# Patient Record
Sex: Male | Born: 1960 | Race: White | Hispanic: No | Marital: Married | Smoking: Never smoker
Health system: Southern US, Community
[De-identification: ages and names within clinical notes are randomized; demographics above are authoritative.]

## PROBLEM LIST (undated history)

## (undated) DIAGNOSIS — R000 Tachycardia, unspecified: Secondary | ICD-10-CM

## (undated) DIAGNOSIS — E785 Hyperlipidemia, unspecified: Secondary | ICD-10-CM

## (undated) DIAGNOSIS — R079 Chest pain, unspecified: Secondary | ICD-10-CM

## (undated) DIAGNOSIS — T887XXA Unspecified adverse effect of drug or medicament, initial encounter: Secondary | ICD-10-CM

## (undated) DIAGNOSIS — I2510 Atherosclerotic heart disease of native coronary artery without angina pectoris: Secondary | ICD-10-CM

## (undated) DIAGNOSIS — I440 Atrioventricular block, first degree: Secondary | ICD-10-CM

## (undated) DIAGNOSIS — Z951 Presence of aortocoronary bypass graft: Secondary | ICD-10-CM

## (undated) HISTORY — DX: Chest pain, unspecified: R07.9

## (undated) HISTORY — DX: Presence of aortocoronary bypass graft: Z95.1

## (undated) HISTORY — PX: HAND SURGERY: SHX662

## (undated) HISTORY — DX: Tachycardia, unspecified: R00.0

## (undated) HISTORY — PX: CORONARY ARTERY BYPASS GRAFT: SHX141

## (undated) HISTORY — DX: Hyperlipidemia, unspecified: E78.5

## (undated) HISTORY — PX: WISDOM TOOTH EXTRACTION: SHX21

## (undated) HISTORY — DX: Atherosclerotic heart disease of native coronary artery without angina pectoris: I25.10

## (undated) HISTORY — DX: Unspecified adverse effect of drug or medicament, initial encounter: T88.7XXA

## (undated) HISTORY — DX: Atrioventricular block, first degree: I44.0

## (undated) HISTORY — PX: VASECTOMY: SHX75

---

## 2004-07-01 ENCOUNTER — Ambulatory Visit: Payer: Self-pay | Admitting: Internal Medicine

## 2005-07-12 ENCOUNTER — Ambulatory Visit: Payer: Self-pay | Admitting: Internal Medicine

## 2005-08-11 ENCOUNTER — Ambulatory Visit: Payer: Self-pay | Admitting: Internal Medicine

## 2007-02-11 ENCOUNTER — Ambulatory Visit: Payer: Self-pay | Admitting: Internal Medicine

## 2007-02-11 LAB — CONVERTED CEMR LAB
ALT: 20 units/L (ref 0–40)
AST: 22 units/L (ref 0–37)
Albumin: 4.2 g/dL (ref 3.5–5.2)
Alkaline Phosphatase: 82 units/L (ref 39–117)
BUN: 11 mg/dL (ref 6–23)
Basophils Absolute: 0 10*3/uL (ref 0.0–0.1)
Basophils Relative: 0.2 % (ref 0.0–1.0)
Bilirubin, Direct: 0.1 mg/dL (ref 0.0–0.3)
CO2: 32 meq/L (ref 19–32)
Calcium: 9.3 mg/dL (ref 8.4–10.5)
Chloride: 109 meq/L (ref 96–112)
Cholesterol: 218 mg/dL (ref 0–200)
Creatinine, Ser: 1 mg/dL (ref 0.4–1.5)
Direct LDL: 161.2 mg/dL
Eosinophils Absolute: 0.1 10*3/uL (ref 0.0–0.6)
Eosinophils Relative: 1.4 % (ref 0.0–5.0)
GFR calc Af Amer: 103 mL/min
GFR calc non Af Amer: 86 mL/min
Glucose, Bld: 88 mg/dL (ref 70–99)
HCT: 47.8 % (ref 39.0–52.0)
HDL: 34.1 mg/dL — ABNORMAL LOW (ref >39.0)
Hemoglobin: 16.4 g/dL (ref 13.0–17.0)
Lymphocytes Relative: 16.7 % (ref 12.0–46.0)
MCHC: 34.4 g/dL (ref 30.0–36.0)
MCV: 90.1 fL (ref 78.0–100.0)
Monocytes Absolute: 1 10*3/uL — ABNORMAL HIGH (ref 0.2–0.7)
Monocytes Relative: 10.4 % (ref 3.0–11.0)
Neutro Abs: 6.6 10*3/uL (ref 1.4–7.7)
Neutrophils Relative %: 71.3 % (ref 43.0–77.0)
Platelets: 279 10*3/uL (ref 150–400)
Potassium: 4.1 meq/L (ref 3.5–5.1)
RBC: 5.31 M/uL (ref 4.22–5.81)
RDW: 11.6 % (ref 11.5–14.6)
Sodium: 143 meq/L (ref 135–145)
TSH: 1.55 microintl units/mL (ref 0.35–5.50)
Total Bilirubin: 1.1 mg/dL (ref 0.3–1.2)
Total CHOL/HDL Ratio: 6.4
Total Protein: 7.6 g/dL (ref 6.0–8.3)
Triglycerides: 134 mg/dL (ref 0–149)
VLDL: 27 mg/dL (ref 0–40)
WBC: 9.2 10*3/uL (ref 4.5–10.5)

## 2007-02-15 DIAGNOSIS — E785 Hyperlipidemia, unspecified: Secondary | ICD-10-CM | POA: Insufficient documentation

## 2007-02-15 HISTORY — DX: Hyperlipidemia, unspecified: E78.5

## 2007-05-14 ENCOUNTER — Telehealth: Payer: Self-pay | Admitting: Internal Medicine

## 2007-07-17 ENCOUNTER — Ambulatory Visit: Payer: Self-pay | Admitting: Internal Medicine

## 2007-07-19 LAB — CONVERTED CEMR LAB
Cholesterol: 218 mg/dL (ref 0–200)
Direct LDL: 165.9 mg/dL
HDL: 32.8 mg/dL — ABNORMAL LOW (ref >39.0)
Total CHOL/HDL Ratio: 6.6
Triglycerides: 135 mg/dL (ref 0–149)
VLDL: 27 mg/dL (ref 0–40)

## 2007-11-05 ENCOUNTER — Telehealth: Payer: Self-pay | Admitting: Internal Medicine

## 2008-01-07 ENCOUNTER — Telehealth: Payer: Self-pay | Admitting: Internal Medicine

## 2008-02-03 ENCOUNTER — Ambulatory Visit: Payer: Self-pay | Admitting: Internal Medicine

## 2008-02-03 DIAGNOSIS — T887XXA Unspecified adverse effect of drug or medicament, initial encounter: Secondary | ICD-10-CM | POA: Insufficient documentation

## 2008-02-03 LAB — CONVERTED CEMR LAB
ALT: 20 units/L (ref 0–53)
AST: 23 units/L (ref 0–37)
Bilirubin, Direct: 0.1 mg/dL (ref 0.0–0.3)
Direct LDL: 149.9 mg/dL
HDL: 31.5 mg/dL — ABNORMAL LOW (ref >39.0)
Total Bilirubin: 1.1 mg/dL (ref 0.3–1.2)
Total Protein: 7 g/dL (ref 6.0–8.3)
Triglycerides: 126 mg/dL (ref 0–149)

## 2008-02-17 ENCOUNTER — Ambulatory Visit: Payer: Self-pay | Admitting: Internal Medicine

## 2008-02-17 DIAGNOSIS — R079 Chest pain, unspecified: Secondary | ICD-10-CM | POA: Insufficient documentation

## 2008-02-17 HISTORY — DX: Chest pain, unspecified: R07.9

## 2008-03-16 ENCOUNTER — Ambulatory Visit: Payer: Self-pay | Admitting: Cardiovascular Disease

## 2008-03-16 LAB — CONVERTED CEMR LAB
Basophils Absolute: 0 10*3/uL (ref 0.0–0.1)
Basophils Relative: 0.1 % (ref 0.0–3.0)
Calcium: 9.6 mg/dL (ref 8.4–10.5)
Creatinine, Ser: 1 mg/dL (ref 0.4–1.5)
Eosinophils Absolute: 0.1 10*3/uL (ref 0.0–0.7)
GFR calc Af Amer: 103 mL/min
GFR calc non Af Amer: 85 mL/min
Hemoglobin: 15.9 g/dL (ref 13.0–17.0)
MCHC: 34.1 g/dL (ref 30.0–36.0)
MCV: 93.2 fL (ref 78.0–100.0)
Neutro Abs: 7.4 10*3/uL (ref 1.4–7.7)
RBC: 5.02 M/uL (ref 4.22–5.81)
RDW: 11.5 % (ref 11.5–14.6)
Sodium: 139 meq/L (ref 135–145)
aPTT: 33.2 s — ABNORMAL HIGH (ref 21.7–29.8)

## 2008-03-17 ENCOUNTER — Ambulatory Visit: Payer: Self-pay | Admitting: Vascular Surgery

## 2008-03-17 ENCOUNTER — Encounter: Payer: Self-pay | Admitting: Surgery

## 2008-03-17 ENCOUNTER — Inpatient Hospital Stay (HOSPITAL_BASED_OUTPATIENT_CLINIC_OR_DEPARTMENT_OTHER): Admission: RE | Admit: 2008-03-17 | Discharge: 2008-03-17 | Payer: Self-pay | Admitting: Cardiology

## 2008-03-17 ENCOUNTER — Ambulatory Visit: Payer: Self-pay | Admitting: Cardiology

## 2008-03-17 ENCOUNTER — Ambulatory Visit (HOSPITAL_COMMUNITY): Admission: RE | Admit: 2008-03-17 | Discharge: 2008-03-17 | Payer: Self-pay | Admitting: Surgery

## 2008-03-19 ENCOUNTER — Ambulatory Visit: Payer: Self-pay | Admitting: Surgery

## 2008-03-23 ENCOUNTER — Inpatient Hospital Stay (HOSPITAL_COMMUNITY): Admission: RE | Admit: 2008-03-23 | Discharge: 2008-03-28 | Payer: Self-pay | Admitting: Surgery

## 2008-03-23 ENCOUNTER — Ambulatory Visit: Payer: Self-pay | Admitting: Cardiology

## 2008-03-23 ENCOUNTER — Ambulatory Visit: Payer: Self-pay | Admitting: Surgery

## 2008-04-15 ENCOUNTER — Ambulatory Visit: Payer: Self-pay | Admitting: Cardiovascular Disease

## 2008-04-21 ENCOUNTER — Encounter: Admission: RE | Admit: 2008-04-21 | Discharge: 2008-04-21 | Payer: Self-pay | Admitting: Surgery

## 2008-04-21 ENCOUNTER — Ambulatory Visit: Payer: Self-pay | Admitting: Cardiothoracic Surgery

## 2008-07-01 ENCOUNTER — Ambulatory Visit: Payer: Self-pay | Admitting: Cardiovascular Disease

## 2008-07-01 LAB — CONVERTED CEMR LAB
ALT: 29 units/L (ref 0–53)
AST: 25 units/L (ref 0–37)
Alkaline Phosphatase: 97 units/L (ref 39–117)
Bilirubin, Direct: 0.1 mg/dL (ref 0.0–0.3)
Total Bilirubin: 0.9 mg/dL (ref 0.3–1.2)

## 2009-01-29 ENCOUNTER — Encounter: Payer: Self-pay | Admitting: Cardiovascular Disease

## 2009-02-08 ENCOUNTER — Telehealth: Payer: Self-pay | Admitting: Cardiovascular Disease

## 2009-06-11 IMAGING — CR DG CHEST 2V
2 series · 2 of 2 positions shown · non-contrast
Comparison: [REDACTED] chest x-ray 03/27/2008

CLINICAL DATA: Slight cough.  Nonsmoker.  Post CABG 03/23/2008.

CHEST - 2 VIEW

[w chest pa]
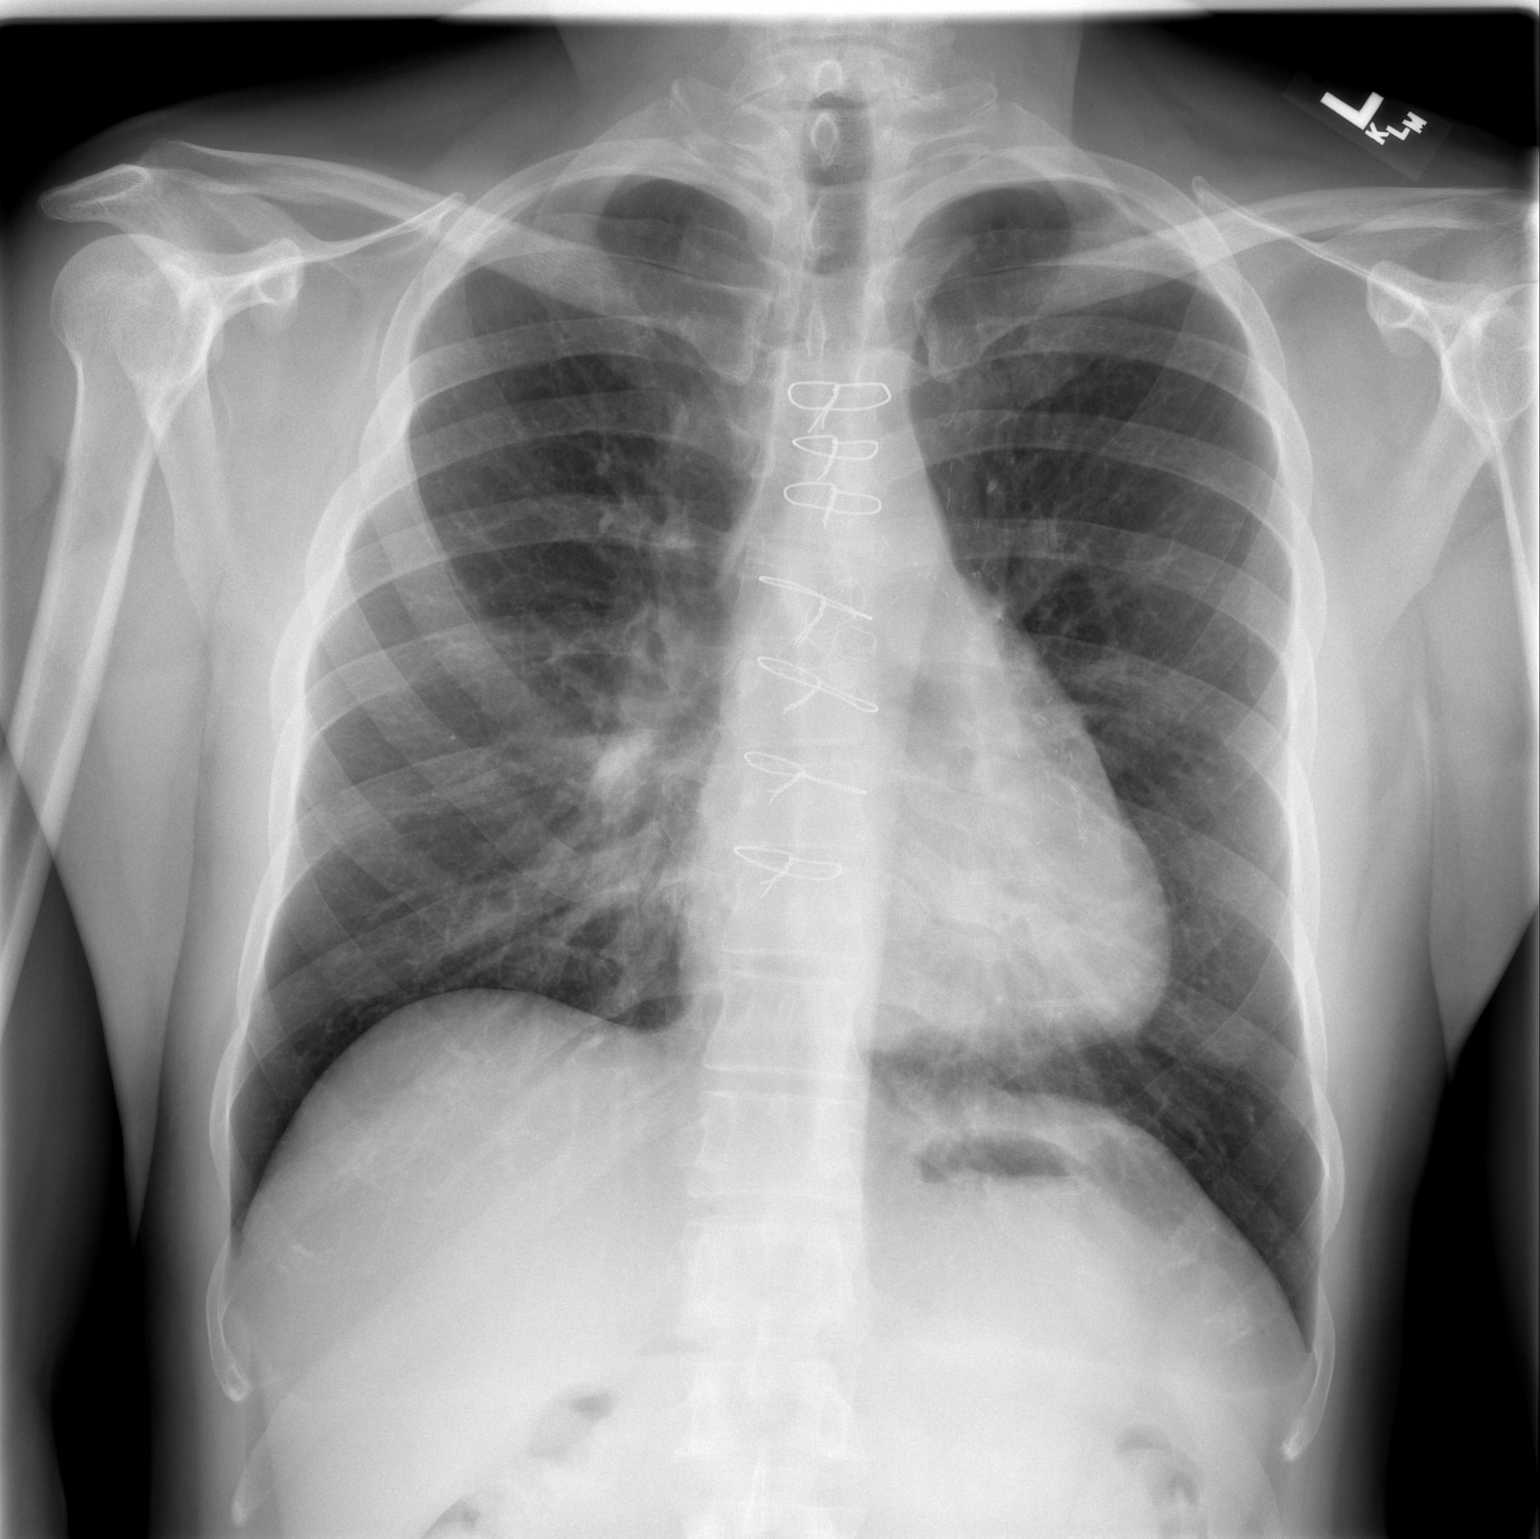

[w chest lat]
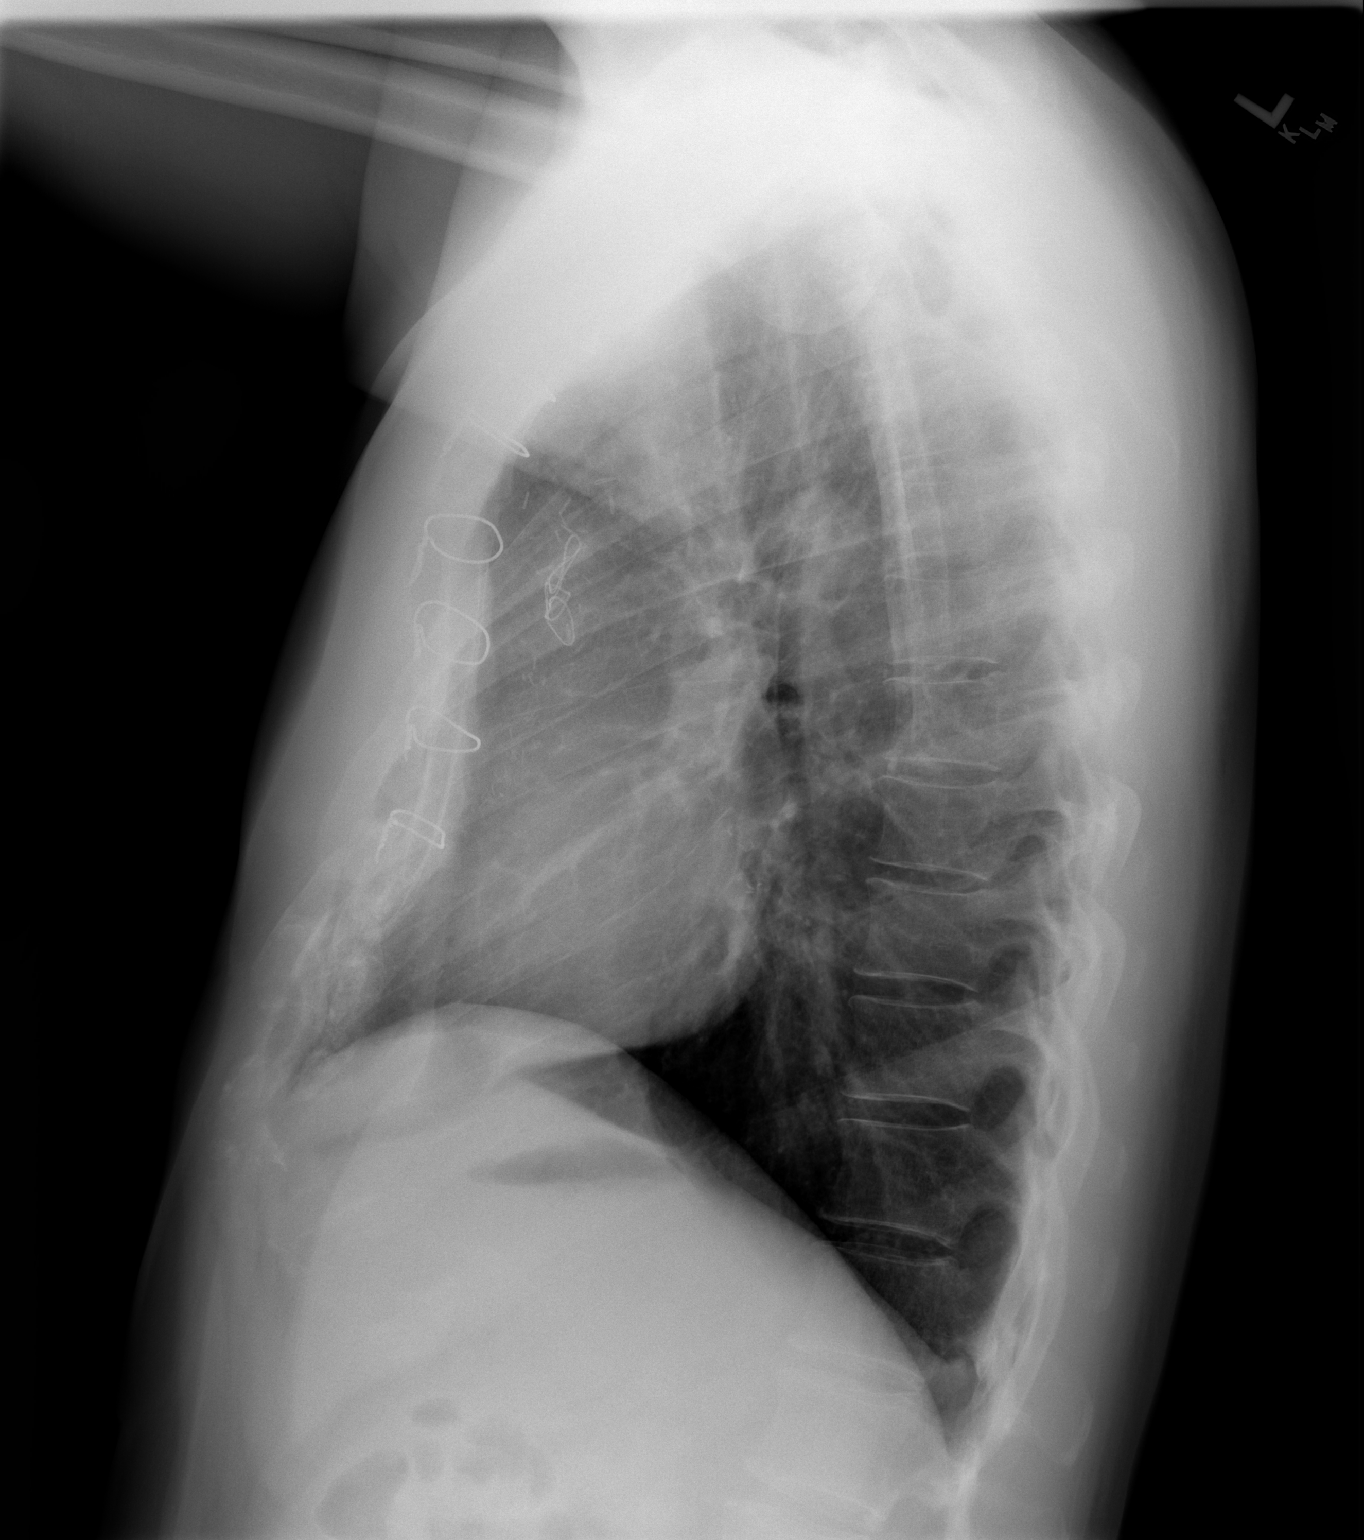

[2 of 2 positions shown; findings below may reference images not displayed]

FINDINGS: Lungs are clear.  Heart size is regressed (currently
normal) with stable post CABG changes.  Mediastinum, hila, pleura
and osseous structures appear normal.  Stable slight pectus
excavatum chest wall deformity visualized.
IMPRESSION: Normal postoperative study.

## 2009-06-29 DIAGNOSIS — R000 Tachycardia, unspecified: Secondary | ICD-10-CM

## 2009-07-01 ENCOUNTER — Ambulatory Visit: Payer: Self-pay | Admitting: Cardiovascular Disease

## 2009-07-02 ENCOUNTER — Telehealth: Payer: Self-pay | Admitting: Cardiovascular Disease

## 2010-07-01 ENCOUNTER — Ambulatory Visit: Payer: Self-pay | Admitting: Cardiovascular Disease

## 2010-07-01 DIAGNOSIS — Z951 Presence of aortocoronary bypass graft: Secondary | ICD-10-CM | POA: Insufficient documentation

## 2010-09-11 ENCOUNTER — Encounter: Payer: Self-pay | Admitting: Family Medicine

## 2010-09-22 NOTE — Assessment & Plan Note (Signed)
Summary: f1y/dm   Visit Type:  Follow-up Primary Provider:  Birdie Sons MD   History of Present Illness: [name] is seen today for F/U of CAD with CABG in July 2010.  His angina is resolved.  He is back to work as a Runner, broadcasting/film/video in [employer].  He has been compliant with his meds.  He is seeing [name] and they are following his lipids.  His triglycerides were elevated and he is now on Trilipix.  He is active and not having any problems.  His right endoscopic harvest site and chest have healed nicely No paresthesias or issues with the left arm after radial harvest.  Working out 3x/week.  Current Problems (verified): 1)  Hyperlipidemia  (ICD-272.4) 2)  Tachycardia  (ICD-785.0) 3)  Chest Pain  (ICD-786.50) 4)  Family History of Cad Male 1st Degree Relative <50  (ICD-V17.3) 5)  Uns Advrs Eff Uns Rx Medicinal&biological Sbstnc  (ICD-995.20)  Current Medications (verified): 1)  Fish Oil 1000 Mg  Caps (Omega-3 Fatty Acids) .... 3 By Mouth Once Daily 2)  Metoprolol Tartrate 25 Mg Tabs (Metoprolol Tartrate) .... Take One Tablet By Mouth Twice A Day 3)  Aspirin Ec 325 Mg Tbec (Aspirin) .... Take One Tablet By Mouth Daily 4)  Crestor 40 Mg Tabs (Rosuvastatin Calcium) .... Take One Tablet By Mouth Daily. 5)  Trilipix 135 Mg Cpdr (Choline Fenofibrate) .Marland Kitchen.. 1 Tab By Mouth Once Daily 6)  Centrum  Tabs (Multiple Vitamins-Minerals) .Marland Kitchen.. 1 Tab By Mouth Once Daily  Allergies: 1)  ! Codeine Sulfate (Codeine Sulfate) 2)  Tylox (Oxycodone-Acetaminophen) 3)  Tylox (Oxycodone-Acetaminophen) 4)  * No Known Allergies Group  Past History:  Past Medical History: Last updated: 06/29/2009 Current Problems:  HYPERLIPIDEMIA (ICD-272.4) TACHYCARDIA (ICD-785.0) CAD:  CABG July 2009 [facility] FAMILY HISTORY OF CAD MALE 1ST DEGREE RELATIVE <50 (ICD-V17.3) UNS ADVRS EFF UNS RX MEDICINAL&BIOLOGICAL SBSTNC (ICD-995.20) incomplete RBBB fam hx CAD-father fam hx ovarian ca-2002--doing well 2008 Postoperative  acute blood loss anemia  Past Surgical History: Last updated: 07/01/2009 Vasectomy Pecters excavation--age 54  right carpal tunnel surgery   wisdom tooth removal.  CABG July 2009 [facility]  Median sternotomy, extracorporeal circulation,  coronary artery bypass graft surgery x5 using a left internal mammary  artery graft to the distal left anterior descending coronary artery,  with a left radial artery graft to the obtuse marginal branch of the  left circumflex coronary artery, a saphenous vein graft to the mid LAD,   a saphenous vein graft to the intermediate coronary artery, and a  saphenous vein graft to the posterior descending branch of the right  coronary artery.  Endoscopic vein harvesting from the right leg Evelene Croon, M.D.  Electronically Signed  BB/MEDQ  D:  03/23/2008  T:  03/24/2008  Job:  [id number]  cc:   [name]. Juanda Chance, MD, [facility]   Family History: Last updated: 02/17/2008 Family History of CAD Male 1st degree relative <50-CABG 50 yo (smoker)  Social History: Last updated: 02/17/2008 Occupation: Runner, broadcasting/film/video Never Smoked Regular exercise-yes  Review of Systems       Denies fever, malais, weight loss, blurry vision, decreased visual acuity, cough, sputum, SOB, hemoptysis, pleuritic pain, palpitaitons, heartburn, abdominal pain, melena, lower extremity edema, claudication, or rash.   Vital Signs:  Patient profile:   50 year old male Height:      70 inches Weight:      189 pounds BMI:     27.22 Pulse rate:   56 / minute BP sitting:  130 / 80  (left arm)  Vitals Entered By: [name] CMA (July 01, 2010 8:56 AM)  Physical Exam  General:  Affect appropriate Healthy:  appears stated age HEENT: normal Neck supple with no adenopathy JVP normal no bruits no thyromegaly Lungs clear with no wheezing and good diaphragmatic motion Heart:  S1/S2 no murmur,rub, gallop or click PMI normal Abdomen: benighn, BS positve, no tenderness, no AAA no bruit.  No HSM or HJR Distal  pulses intact with no bruits No edema Neuro non-focal Skin warm and dry Radial harvest on left forearm   Impression & Recommendations:  Problem # 1:  CORONARY ARTERY BYPASS GRAFT, HX OF (ICD-V45.81) Stable with no angina.  Continue ASA and BB.  Good risk factor modification  Problem # 2:  HYPERLIPIDEMIA (ICD-272.4) F/U labs [place] family Olson His updated medication list for this problem includes:    Crestor 40 Mg Tabs (Rosuvastatin calcium) .Marland Kitchen... Take one tablet by mouth daily.    Trilipix 135 Mg Cpdr (Choline fenofibrate) .Marland Kitchen... 1 tab by mouth once daily  Other Orders: EKG w/ Interpretation (93000)  Patient Instructions: 1)  Your physician recommends that you schedule a follow-up appointment in: YEAR WITH DR Eden Emms 2)  Your physician recommends that you continue on your current medications as directed. Please refer to the Current Medication list given to you today.   EKG Report  Procedure date:  07/01/2010  Findings:      NSR 56 PR 244 LAE Nonspecific ST/T wave changes

## 2011-01-03 NOTE — Assessment & Plan Note (Signed)
[facility]                            CARDIOLOGY OFFICE NOTE   NAME:[name], [name]                MRN:          [id number]  DATE:03/16/2008                            DOB:          August 06, 1961    A 50 year old patient referred for chest pain.  The patient has coronary  risk factors including hyperlipidemia.   He has been having chest pain.  The chest pain has been going on for 6-8  months.  It has some classic exertional features.  He can walk about  3/10's of a mile and then he gets pain in his chest, it then eases up.  If he continues to walk through it, it gets better.  There is an  occasional positional component to it when he is leaned over for a  prolonged periods of time.  It is not necessarily related to food.  I  talked to the patient at length and I really thought that a lot of his  symptoms sound like classic angina.   The pain has been somewhat progressive over the last 6-8 months.   He has not had any resting pain or prolonged pain.  He has been  compliant with his cholesterol pills.   He has had an IVP in the past and there is a question of an iodine  allergy.  The patient is very anxious about his symptoms, but also more  anxious about any invasive procedures.  I talked to he and his wife at  length.  I think given the fairly classic exertional component to his  pain, I would recommend a heart cath to him.  The risk including stroke  we went over in detail.  I do not think a dye reaction will be an issue  if he is premedicated.  I told him that if he refused to have a heart  cath, we certainly could do a stress test or cardiac CT, but I am  somewhat concerned about the classic nature of his symptoms and would  really recommend a diagnostic heart cath.  After a lengthy discussion,  he agrees.   His review of systems is, otherwise, negative.   His past medical history is remarkable for hyperlipidemia, previous  pectus  excavatum surgery, wisdom teeth removal, vasectomy, and carpal  tunnel surgery.   The patient is allergic to CODEINE and IODINE.   He is on Lipitor 40 a day, aspirin a day, and fish oil.   The patient does not smoke or drink.  He is happily married.  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  He and his wife met at [employer]  in [place].  He has 2 children.  He is, otherwise, fairly  sedentary.   Family history is unremarkable.  Mother and father are still alive.  However, his father did a bypass surgery at age 43.   PHYSICAL EXAMINATION:  GENERAL:  Remarkable for healthy-appearing white  male in no distress.  VITAL SIGNS:  His blood pressure is 130/70, pulse is 70 and regular,  respiratory rate 14, afebrile, and weight 185.  HEENT:  Unremarkable.  NECK:  Carotids normal without bruit.  No lymphadenopathy, thyromegaly,  or JVP elevation.  LUNGS:  Clear.  Good diaphragmatic motion.  No wheezing.  HEART:  S1 and S2 normal heart sounds.  PMI normal.  He is status post  pectus surgery in the sternomanubrial junction.  ABDOMEN:  Benign.  Bowel sounds positive.  No AAA.  No tenderness.  No  bruit.  No hepatosplenomegaly.  No hepatojugular reflux.  EXTREMITIES:  Distal pulses intact.  No edema.  NEUROLOGIC:  Nonfocal.  SKIN:  Warm and dry.  No muscular weakness.   EKG is normal.   IMPRESSION:  1. Exertional chest pain somewhat worrisome for angina.  Proceed with      diagnostic heart cath.  Risks explained. The patient will need to      be premedicated with 60 of prednisone tonight and in the morning,      as well as 25 of Benadryl, and Pepcid.  2. Hyperlipidemia.  Continue Lipitor.  Follow up with Dr. Cato Mulligan.  3. Previous pectus surgery.  Chest wall currently is stable and I      doubt it has anything to do with his chest pain syndrome.   Further recommendations will be based on the results of his heart cath.   Note should be made that the patient will need to  be sedated for his  exam.  He is quite nervous.  I would recommend that the patient have 2-4  mg of Versed at the time of his heart cath.     Noralyn Pick. Eden Emms, MD, [facility]  Electronically Signed    PCN/MedQ  DD: 03/16/2008  DT: 03/17/2008  Job #: [id number]   cc:   Valetta Mole. Swords, MD

## 2011-01-03 NOTE — Op Note (Signed)
[name], [name]               ACCOUNT NO.:  [id number]   MEDICAL RECORD NO.:  [id number]          PATIENT TYPE:  INP   LOCATION:  2310                         FACILITY:  [facility]   PHYSICIAN:  Evelene Croon, M.D.     DATE OF BIRTH:  [birth year]   DATE OF PROCEDURE:  03/23/2008  DATE OF DISCHARGE:                               OPERATIVE REPORT   PREOPERATIVE DIAGNOSIS:  Severe three-vessel coronary disease with  unstable angina.   POSTOPERATIVE DIAGNOSIS:  Severe three-vessel coronary disease with  unstable angina.   OPERATIVE PROCEDURE:  Median sternotomy, extracorporeal circulation,  coronary artery bypass graft surgery x5 using a left internal mammary  artery graft to the distal left anterior descending coronary artery,  with a left radial artery graft to the obtuse marginal branch of the  left circumflex coronary artery, a saphenous vein graft to the mid LAD,  a saphenous vein graft to the intermediate coronary artery, and a  saphenous vein graft to the posterior descending branch of the right  coronary artery.  Endoscopic vein harvesting from the right leg.   ATTENDING SURGEON:  Evelene Croon, MD   ASSISTANT:  Doree Fudge, PA-C.   ANESTHESIA:  General endotracheal.   CLINICAL HISTORY:  This patient is a 50 year old gentleman referred by  Dr. Charlies Constable with no prior cardiac history who did have a positive  family history of heart disease as well as hyperlipidemia.  He had a 8-  month history of episodic exertional related substernal chest pain that  was worsening.  He underwent cardiac catheterization by Dr. Juanda Chance,  which showed severe three-vessel coronary artery disease.  The LAD had  about 40% proximal stenosis.  There were three diagonal branches.  There  is about 95% LAD stenosis after the first diagonal branch.  There was  another 95% stenosis in the midportion of the LAD beyond the third  diagonal branch.  This resulted in a mid segment of the LAD that  supplied the second and third diagonal branches as well as significant  septals, which was isolated by this high-grade proximal LAD stenosis.  Then the distal LAD was also isolated due to the 95% mid-to-distal  vessel stenosis.  The left circumflex gave off a moderate size,  intermediate branch had 80% proximal stenosis.  The left circumflex then  had about 80% proximal stenosis before the takeoff of a large obtuse  marginal branch that also had about 80% proximal stenosis in it.  The  distal left circumflex gave off three small posterolateral branches that  did not appear to be graftable.  The right coronary was occluded  proximally with filling of the distal vessel by collaterals from the  left circumflex.  Left ventricular ejection fraction is about 60% with  no wall motion abnormalities.  After review of the angiogram and  examination of the patient, it was felt that coronary bypass graft  surgery was the best treatment to prevent further ischemia and  infarction.  I discussed the operative procedure with the patient and  his wife including alternatives, benefits, and risks including but not  limited to  bleeding, blood transfusion, infection, stroke, myocardial  infarction, graft failure, and death.  Also discussed the possible use  of the left radial artery as a graft conduit.  We did preoperative  arterial Dopplers that showed no change in the palmar arch waveform with  radial compression by complete obliteration of the ulnar compression.  Therefore, I felt it would be safe to use the radial artery.  The  patient was right handed.  I discussed using the radial artery with the  patient and his wife including a small chance of ischemic complications  or paresthesias due to the sensory nerve to the forearm.  They  understood and agreed with this plan.   OPERATIVE PROCEDURE:  The patient was taken to the room and placed on  the table in supine position.  After induction of general  endotracheal  anesthesia, a Foley catheter was placed in the bladder using sterile  technique.  Then, the patient was positioned with the left arm out on an  armboard.  The chest, abdomen, and both lower extremities as well as the  left arm were prepped and draped in usual sterile manner.  Transesophageal echocardiogram was performed due to a history of chronic  headaches for at least the last year.  I felt it would be best to make  sure that he did not have a patent foramen ovale that could be  responsible for his headaches.  The TEE showed no evidence of a PFO.  There was good left and right ventricular function.  There was mild  mitral regurgitation.  There was no aortic insufficiency or stenosis.   Then, the left radial artery was harvested as a free graft.  This was  performed through a longitudinal incision over the artery.  I initially  made a small incision over the distal artery just proximal to the wrist.  The radial artery was encircled.  An atraumatic vascular clamp was  placed across the artery and a Doppler was used to listen to the  arterial signal beyond a clamp.  Even with the radial artery clamp,  there was a good Doppler signal in the right radial artery at the wrist  level.  Therefore, I felt it would be safe to use the radial artery.  The remainder of the radial artery was exposed.  Care was taken to avoid  the sensory nerves to the skin.  Then, the harmonic scalpel was used to  divide all of the side branches.  Proximal dissection was stopped just  before the radial artery gave a large intramuscular branch.  The radial  artery was divided distally and the ends suture ligated with the 2-0  silk suture ligature.  There was excellent flow through the artery.  Then, the artery was ligated proximally using a 2-0 silk suture and  divided.  The radial artery was then flushed with a papaverine saline  solution.  The side branches were clipped and the radial artery marked   to prevent rotation.  It was then placed in papaverine saline solution  and the incision was closed in layers using continuous 2-0 Vicryl to  reapproximate the fascia, 2-0 Vicryl subcutaneous suture, and a 3-0  subcuticular skin closure.  The sponge, needle, and instrument counts  were correct according to the scrub nurse.  Dermabond was placed over  the incision.  The arm was then wrapped with sterile dressing and  repositioned at the left side.   Then attention was turned to the chest.  The sternum  was opened through  a standard median sternotomy incision.  The patient previously had a  pectus repair about 40 years ago.  He had minimal outward chest  deformity.  The sternum was opened without difficulty.  There was slight  remaining pectus deformity.  The pericardium was opened in midline.  Examination of the heart showed good ventricular contractility.  The  ascending aorta had no palpable plaques in it.   Then the left internal mammary artery was harvested from the chest wall  as a pedicle graft.  This was a medium-caliber vessel with excellent  blood flow through it.  The dissection was somewhat difficult distally  due to the deformity of the sternum, but I was able to separate the  mammary pedicle from the chest wall without injury.  The patient was  then heparinized and the mammary pedicle was transected distally and the  ends suture ligated.   Then, while the left internal mammary pedicle was being harvested, a  segment of right greater saphenous vein was harvested using endoscopic  vein harvest technique.  This vein was of medium size and good quality.   Then, when an adequate activated clotting time was achieved, the distal  ascending aorta was cannulated using a 20-French aortic cannula for  arterial inflow.  Venous outflow was achieved using a two-stage venous  cannula to the right atrial appendage.  An antegrade cardioplegia and  vent cannula was inserted in the aortic  root.   The patient was placed on cardiopulmonary bypass and the distal  coronaries identified.  The LAD was a medium sized graftable vessel  distally.  The second and third diagonal branches were fairly small, and  therefore I located the midportion of the LAD in this region.  There was  moderate disease in the midportion of the LAD, but this area was  certainly graftable to supply the diagonal branches and the septals.  The first diagonal branch was a moderate-sized vessel that did not need  to be grafted.  The intermediate was a moderate-sized vessel.  The  obtuse marginal was intramyocardial along its entire extent.  I was able  to locate proximally and traced it briefly before it became a deep  intramuscular vessel.  There was no significant disease in its proximal  portion and therefore I decided to use this area for grafting.  The  second marginal branch was small and also intramyocardial and not  visible.  The posterolateral branches were visible but were all small  non-graftable vessels.  The right coronary artery was diffusely diseased  but gave off a moderate-sized posterior descending branch, which was  graftable.   Then, the aorta was cross-clamped and 1500 mL of cold blood antegrade  cardioplegia was administered in the aortic root with quick arrest of  the heart.  Systemic hypothermia to 28 degrees centigrade and topical  hypothermia with iced saline was used.  A temperature probe was placed  in the septum and insulating pad in the pericardium.   The first distal anastomosis was performed to the posterior descending  coronary artery.  The internal diameter was about 1.6 mm.  The conduit  used was a segment of greater saphenous vein and the anastomosis was  performed in an end-to-side manner using continuous 7-0 Prolene suture.  Flow was administered to the graft and was excellent.  Then, another  dose of cardioplegia was given down this vein graft.   The second distal  anastomosis was performed to the obtuse marginal  branch.  The internal diameter was about 2 mm.  The conduit used was a  left radial artery graft and this was anastomosed in an end-to-side  manner using continuous 8-0 Prolene suture.  Flow was administered to  the graft and was excellent.   Then the third distal anastomosis was performed to the intermediate  vessel.  The internal diameter was about 1.6 mm.  The conduit used was a  second segment of greater saphenous vein and the anastomosis was  performed in an end-to-side manner using continuous 7-0 Prolene suture.  Flow was administered to the graft and was excellent.   The fourth distal anastomosis was performed to the midportion of the  left anterior descending coronary artery.  The internal diameter here  was about 2.5 mm.  The conduit used was a third segment of greater  saphenous vein and the anastomosis was performed in an end-to-side  manner using continuous 7-0 Prolene suture.  Flow was administered to  the graft and was excellent.  Then another dose of cardioplegia was  given down the vein grafts and aortic root.   The fifth distal anastomosis was performed to the distal LAD.  The  internal diameter was about 1.75 mm.  The conduit used was the left  internal mammary graft and was brought through an opening left  pericardium anterior to the phrenic nerve.  This was anastomosed to LAD  in an end-to-side manner using continuous 8-0 Prolene suture.  Flow was  administered to the graft and was excellent.  The pedicle was sutured to  the epicardium with 6-0 Prolene sutures.  Then, another dose of  cardioplegia was given.   With the cross-clamp in place, the patient was rewarmed to 37 degrees  centigrade and the three proximal vein graft anastomoses were performed  in the aortic root in an end-to-side manner using continuous 6-0 Prolene  suture.  The proximal anastomosis of the radial artery graft was  performed at the hood of  the obtuse marginal vein graft in an end-to-  side manner using continuous 7-0 Prolene suture.  Then, the clamp was  removed from the mammary pedicle.  There was rapid warming of the  ventricular septum and return of spontaneous ventricular fibrillation.  The cross-clamp was removed with the time of 122 minutes, and the  patient spontaneously converted to sinus rhythm.  The proximal and  distal anastomoses appeared hemostatic and lie the grafts satisfactory.  Graft markers were placed around the proximal anastomoses.  Two  temporary right ventricular and right atrial pacing wires were placed  above the skin.   When the patient was rewarmed at 37 degrees centigrade, he was weaned  from cardiopulmonary bypass on no inotropic agents.  Total bypass time  was 146 minutes.  His cardiac function appeared excellent with a cardiac  output of 4 L per minute.  Protamine was given and the venous and aortic  cannulas were removed without difficulty.  Hemostasis was achieved.  Three chest tubes were placed with the tube in the posterior  pericardium, one in the left pleural space and one in the anterior  mediastinum.  The pericardium could not be reapproximated over the  heart.  The sternum was closed with #6 stainless steel wires.  The  fascia was closed with continuous #1 Vicryl suture.  Subcutaneous tissue  was closed with continuous 2-0 Vicryl and the skin with a 3-0 Vicryl  subcuticular closure.  The lower extremity vein harvest site was closed  in layers  in a similar manner.  The sponge, needle, and instrument  counts were correct according to the scrub nurse.  Dry sterile dressings  were applied over the incisions around the chest tubes, which were Pleur-  Evac suctioned.  The patient remained hemodynamically stable and was  transported to the SICU in guarded but stable condition.      Evelene Croon, M.D.  Electronically Signed     BB/MEDQ  D:  03/23/2008  T:  03/24/2008  Job:   [id number]   cc:   Everardo Beals. Juanda Chance, MD, [facility]

## 2011-01-03 NOTE — Consult Note (Signed)
NEW PATIENT CONSULTATION   [name], [name]  DOB:  04/20/1961                                        March 19, 2008  CHART #:  [id number]   REFERRING PHYSICIAN:  Everardo Beals. Juanda Chance, MD, [facility]   REASON FOR CONSULTATION:  Severe 3-vessel coronary artery disease.   CLINICAL HISTORY:  I was asked by Dr. Juanda Chance to evaluate the patient for  consideration of coronary artery bypass graft surgery.  He is a 50-year-  old gentleman with no prior cardiac history.  He does have a positive  family history of heart disease as well as hyperlipidemia.  He reports a  6-8 month history of episodic exertionally related substernal chest  pain.  He is active and walks daily and said he typically develops a  chest pain after walking about 3 times of a mile.  If he stops, it  resolves.  Sometimes, he continues to walk and it gets better and  resolves.  He underwent cardiac catheterization by Dr. Juanda Chance on  09/18/2007 which showed severe 3-vessel coronary artery disease.  The  LAD had about 40% proximal stenosis.  There were 3 diagonal branches.  There was about 95% LAD stenosis after the first diagonal branch.  There  was another 95% stenosis in the mid portion beyond the third diagonal  branch.  The left circumflex gave off a moderate-sized marginal branch  that had 80% proximal stenosis.  The left circumflex then had an 80%  proximal stenosis before the takeoff of a large obtuse marginal that had  80% proximal stenosis in it.  The distal left circumflex gave off 3  small posterolateral branches.  The right coronary artery was occluded  proximally with filling of the distal vessel by collaterals from the  left circumflex.  Left ventricular ejection fraction was about 60% with  no wall motion abnormalities.   REVIEW OF SYSTEMS:  General:  He denies any fever or chills.  He denies  any recent weight changes.  He has had some fatigue.  Eyes:  Negative.  ENT:  Negative.  Endocrine:  He  denies diabetes and hypothyroidism.  Cardiovascular:  As above.  He denies PND or orthopnea.  He has had  exertional dyspnea.  He denies peripheral edema or palpitations.  Respiratory:  Denies cough and sputum production.  GI:  He has had no  nausea or vomiting.  He denies melena and bright red blood per rectum.  GU:  He denies dysuria and hematuria.  Vascular:  He denies claudication  and phlebitis.  Neurologic:  He has had frequent headaches over the past  year which have been pretty much constant.  He denies any focal weakness  or numbness.  He denies dizziness and syncope.  He has never had a TIA  or stroke.  Psychiatric:  Negative.  Hematological:  Negative.   ALLERGIES:  Codeine which gives him migraine headaches and iodine which  causes hives.   MEDICATIONS:  Lipitor 40 mg daily, aspirin 81 mg daily, Lopressor 25 mg  b.i.d., fish oil 1000 mg daily, and sublingual nitroglycerin p.r.n.   PAST MEDICAL HISTORY:  Significant for hyperlipidemia.   PAST SURGICAL HISTORY:  Previous surgeries include vasectomy and pectus  excavatum repair as a child.  He has had right carpal tunnel surgery and  wisdom tooth removal.   SOCIAL HISTORY:  He is a Runner, broadcasting/film/video in [employer].  He is  married and lives with his wife and has 2 children.  He has never smoked  and denies alcohol abuse.   FAMILY HISTORY:  Positive for cardiac disease.  His father had bypass  surgery at age 20 and his mother has probable history of heart disease.   PHYSICAL EXAMINATION:  VITAL SIGNS:  Blood pressure is 129/81, pulse is  70 and regular, and respiratory rate is 18 and unlabored.  Oxygen  saturation on room air is 96%.  GENERAL:  He is a well-developed white male in no distress.  HEENT:  Shows him to be normocephalic and atraumatic.  Pupils are equal  and reactive to light and accommodation.  Extraocular muscles are  intact.  His throat is clear.  NECK:  Normal carotid pulses bilaterally.  There are no  bruits.  There  is no adenopathy or thyromegaly.  CARDIAC:  Regular rate and rhythm with normal S1 and S2.  There is no  murmur, rub, or gallop.  LUNGS:  Clear.  ABDOMEN:  Active bowel sounds.  His abdomen is soft and nontender.  There are no palpable masses or organomegaly.  There is a well-healed  midline chest incision from prior pectus repair.  EXTREMITIES:  No peripheral edema.  Pedal pulses are palpable  bilaterally.  SKIN:  Warm and dry.  Radial pulses are strongly palpable bilaterally.  NEUROLOGIC:  Shows him to be alert and oriented x3.  Motor and sensory  exams are grossly normal.   Vascular laboratory examination reveals no internal carotid artery  stenosis.  His upper extremity arterial Doppler exam shows Doppler  waveforms remains normal with ulnar and radial compressions on the  right.  On the left, the palmar arch signal obliterates with ulnar  compression, but remains normal with radial compression.   IMPRESSION:  The patient has severe 3-vessel coronary artery disease.  I  agree that coronary artery bypass graft surgery is the best treatment to  prevent further ischemia and infarction.  We will plan to use bilateral  internal mammary arteries if they have not been injured by previous  pectus repair and a left radial artery graft.  We would also plan to use  saphenous vein for less important arteries.  I discussed the operative  procedure with the patient and his wife including the operative plan as  noted above.  We discussed the benefits and risks of surgery including,  but not limited to bleeding, blood transfusion, infection, stroke,  myocardial infarction, graft failure, and death.  He understands and  agrees to proceed.  We will plan to do surgery on Monday, 03/23/2008.   Evelene Croon, M.D.  Electronically Signed   BB/MEDQ  D:  03/19/2008  T:  03/20/2008  Job:  [id number]   cc:   Everardo Beals. Juanda Chance, MD, [facility]  Bruce H. Swords, MD

## 2011-01-03 NOTE — Assessment & Plan Note (Signed)
OFFICE VISIT   RANDON, SOMERA  DOB:  11-09-1960                                        April 21, 2008  CHART #:  [id number]   REASON FOR OFFICE VISIT:  Followup status post coronary artery bypass  grafting surgery x5 by Dr. Laneta Simmers on 03/23/2008.   HISTORY OF PRESENTING ILLNESS:  This is a 50 year old Caucasian male who  had complaints of chest pain upon exertion.  A cardiac catheterization  was done, which showed multivessel coronary artery disease.  The patient  underwent coronary artery bypass grafting x5.  His EVH of the right  lower extremity and open radial harvest of the left radial artery by Dr.  Laneta Simmers on 03/23/2008.  Since having been discharged from the hospital,  the patient continues to progress well.  He has been increasing his  activity daily.  His only complaint is occasional sternal incisional  pain.  He denies any shortness of breath or chest pain.   PHYSICAL EXAMINATION:  GENERAL:  This is a 50 year old Caucasian male  who is in no acute distress who is alert, oriented, and cooperative.  VITAL SIGNS:  BP 112/73, pulse rate 74, respiratory rate 18, and O2 sat  96% on room air.  CARDIOVASCULAR:  Regular rate and rhythm.  S1 and S2.  No murmurs,  gallops, or rubs.  LUNGS:  Clear to auscultation bilaterally.  No rales, wheezes, or  rhonchi.  ABDOMEN:  Soft and nontender.  Bowel sounds are present throughout.  EXTREMITIES:  No cyanosis, clubbing, or edema in the left lower  extremity.  Right lower extremity has trace edema.  Sternum is solid.  Wound is clean and dry.  Right lower extremity wound is clean and dry.  Right calf is slightly swollen.  No warmth, erythema, or tenderness  noted, however.  Left arm wound well healed.  NEUROLOGIC:  Motor and sensory intact.  There is a small stitch knot  present, this was removed without difficulty.   Chest x-ray done today showed the lungs to be clear bilaterally.  No  pneumothorax  and no effusion seen.   IMPRESSION AND PLAN:  The patient is surgically stable, status post  coronary artery bypass graft x5.  He has already seen the cardiologist  who has decreased his Lopressor to 50 mg p.o. twice daily.  His Imdur  has been stopped.  He will continue to be followed closely by the  cardiologist and he will see Dr. Laneta Simmers on a p.r.n. basis.  The patient  was also instructed if he develops any increased swelling, tenderness,  erythema, or warmth of the right lower external extremity, he is to  contact the office immediately.   Evelene Croon, M.D.  Electronically Signed   DZ/MEDQ  D:  04/21/2008  T:  04/22/2008  Job:  [id number]   cc:   Valetta Mole. Cato Mulligan, MD  Everardo Beals. Juanda Chance, MD, [facility]

## 2011-01-03 NOTE — Cardiovascular Report (Signed)
[name], [name]               ACCOUNT NO.:  [id number]   MEDICAL RECORD NO.:  [id number]          PATIENT TYPE:  OIB   LOCATION:  1965                         FACILITY:  [facility]   PHYSICIAN:  Verne Carrow, MDDATE OF BIRTH:  Sep 28, 1960   DATE OF PROCEDURE:  DATE OF DISCHARGE:  03/17/2008                            CARDIAC CATHETERIZATION   PROCEDURES:  1. Left heart catheterization.  2. Selective coronary angiography.  3. Left ventricular angiogram with left ventricular pressures.   OPERATORS:  1. Bruce R. Juanda Chance, MD, [facility]  2. Verne Carrow, MD.   INDICATION:  A 50 year old Caucasian male with a history of  hyperlipidemia and a family history of coronary artery disease who has  been having exertional chest discomfort for the last 6-8 months.  He was  seen by Dr. [name] in the office yesterday, and based on his  family history as well as his story, Dr. Eden Emms felt that a left heart  catheterization was indicated today.   DETAILS OF PROCEDURE:  The patient was brought to the heart  catheterization laboratory after signing informed consent.  His right  groin was prepped and draped in a sterile fashion.  The right femoral  artery was engaged with 4-French arterial sheath using the Seldinger  technique.  Diagnostic 4-French JL4 and 3DRC catheters were used for the  coronary angiogram.  Following the coronary angiogram, a left  ventricular angiogram was performed with a pigtail catheter.  The  patient tolerated the procedure well.  The sheath was removed here in  the catheterization laboratory.  The patient was taken to the recovery  area in stable condition.  He did receive 2 mg of Versed and 25 mcg of  fentanyl here in the catheterization laboratory.   FINDINGS:  1. Left main coronary artery was normal.  2. The proximal portion of the left anterior descending coronary      artery showed a 40% stenosis.  There are 3 diagonal branches that      arise from  the LAD and they are all normal.  Just distal to the      first diagonal artery was a 95-99% stenosis.  Just distal to the      third diagonal coronary artery was another 95-99% stenosis.  3. The circumflex artery also rose from the left main coronary artery      and showed an 80% stenosis in the mid circumflex.  There were      several obtuse marginal coronary arteries that arise from the      circumflex.  There was an ostial 80% stenosis of the first obtuse      marginal. There is a large Ramus intermedius branch that arises      from the proximal portion of the Circumflex that has an 80%      stenosis.  4. The right coronary artery was a dominant artery and had 100% lesion      in the proximal portion.  The mid and distal right coronary artery      filled from collaterals via the circumflex system.  5. Left ventricular angiogram  showed normal wall motion and ejection      fraction, EF=60%..  The left ventricular pressure was 131/3 with an      end-diastolic pressure of 5.   IMPRESSION:  1. Triple-vessel coronary artery disease.  2. Normal left ventricular function.   RECOMMENDATIONS:  Based on this patient's triple-vessel coronary artery  disease and his age, we have elected to send him for a surgical  consultation for a possible four or five-vessel coronary artery bypass  grafting surgery.  He will be discharged to home today following his  recovery from the catheterization.  The patient understands that should  he have any worsening of his symptoms, he should call us immediately.      Verne Carrow, MD  Electronically Signed     CM/MEDQ  D:  03/17/2008  T:  03/18/2008  Job:  [id number]

## 2011-01-03 NOTE — Assessment & Plan Note (Signed)
HEALTHCARE                            CARDIOLOGY OFFICE NOTE   NAME:[name], [name]                MRN:          [id number]  DATE:04/15/2008                            DOB:          1961/01/13    HISTORY OF PRESENT ILLNESS:  A 50 year old patient.  I initially saw him  in the office with significant angina.  I referred him for cath.  This  was done by Dr. Juanda Chance.  He had severe three-vessel disease.  He had  coronary artery bypass surgery by Dr. Laneta Simmers.  He has good LV function.  He has recovered well.  He is going to do his cardiac rehab in [place].  He will see Dr. Laneta Simmers next Tuesday and have his followup chest x-ray.  He has done fairly well.  He is having some paresthesias over the left  chest, which is usual.  He does not have any significant steal from his  radial harvest site.  He wants to go back to work in the first part of  October.  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  He has been compliant with  his meds.  I told we could probably cut back on his Lopressor and stop  his Isordil at this point.   ALLERGIES:  He is allergic to CODEINE, and IODINE.   MEDICATIONS:  1. Lipitor 40 a day.  2. Fish oil.  3. Lopressor to be decreased to 50 b.i.d.  4. Isordil to be stopped.  5. An aspirin a day.   PHYSICAL EXAMINATION:  GENERAL:  Remarkable for middle-aged white male,  in no distress.  VITAL SIGNS:  Blood pressure is 110/69, pulse 70 and regular,  respiratory rate 14, afebrile, weight is 171.  HEENT:  Unremarkable.  Carotids are normal without bruit, no  lymphadenopathy, no thyromegaly, no JVP elevation.  LUNGS:  Clear.  Good diaphragmatic motion.  No wheezing.  S1 and S2.  Normal heart sounds.  PMI normal.  Sternum is well-healed.  He has had a  previous pectus operation and now has CABG.  ABDOMEN:  Benign.  Bowel sounds positive.  No AAA, no tenderness, no  bruit, no hepatosplenomegaly, no hepatojugular reflux, no tenderness.  EXTREMITIES:  Distal pulses are intact.  No edema.  NEURO:  Nonfocal.  SKIN:  Warm and dry.  MUSCULOSKELETAL:  No muscular weakness.  Radial harvest site well-healed  with good ulnar pulse.   IMPRESSION:  1. Coronary bypass surgery, stable.  Continue aspirin.  Follow up with      Dr. Laneta Simmers.  2. Hyperlipidemia in setting of coronary artery disease.  Continue      Lipitor.  Lipid and liver profile in 6 months.  3. Relative tachycardia.  Continue Lopressor.  Decrease dose to 50      b.i.d.  Stopping Isordil may help as well.   The patient will sign up for is cardiac rehab.  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  I will see him back in  3  months.  His electrocardiogram today showed sinus rhythm with  incomplete right bundle-branch block and insignificant Q waves in III  and F.     Noralyn Pick. Eden Emms, MD, [facility]  Electronically Signed    PCN/MedQ  DD: 04/15/2008  DT: 04/16/2008  Job #: [id number]

## 2011-01-03 NOTE — Assessment & Plan Note (Signed)
[facility]                            CARDIOLOGY OFFICE NOTE   NAME:Olson, Derrick ELENES                MRN:          [id number]  DATE:07/01/2008                            DOB:          1960-10-30    [name] returns today for followup.  He has coronary bypass surgery by  Dr. Laneta Simmers for severe three vessel disease.   This was done at the end of July.   She went back to work full time in October.  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  He had been coming up here to see Dr.  Cato Mulligan.  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  I suggested that he see Lorenda Ishihara in his practice in  [place] for his local care, otherwise he is doing well.  He has some  paresthesias in his legs, radial artery site, and chest.  He seemed to  be improving.  He gets a little bit of edema in the right leg from the  endoscopic harvest site.   Otherwise, he is not having any significant chest pain.  He is walking  on a regular basis and wants to do a increased exercise program which I  told him was okay.  He needs to get a lipid and liver profile checked  today.  I explained to him that on a statin drug, he should have his  liver checked twice a year.   He is otherwise had been compliant with his meds.  I also told him that  we would try to cut his Lopressor back to 25 b.i.d. since his blood  pressure and pulse were in a good range and I would like to minimize his  medication given his young age.  He is allergic to CODEINE and IODINE.  He is on Lipitor 40 a day, fish oil.  His isosorbide has been stopped.  He is on an aspirin a day and his Lopressor is to be decreased to 25  b.i.d.   PHYSICAL EXAMINATION:  GENERAL:  Remarkable for healthy-appearing male  in no distress.  VITAL SIGNS:  His weight is 187, blood pressure 115/75, pulse 58 and  regular, respiratory rate 14, afebrile.  HEENT:  Unremarkable.  Carotids are  without bruit.  No lymphadenopathy,  thyromegaly, or JVP elevation.  LUNGS:  Clear.  Good diaphragmatic motion.  No wheezing.  S1 and S2,  normal heart sounds.  PMI normal.  ABDOMEN:  Benign.  Bowel sounds positive.  No AAA, no tenderness, no  bruit, no hepatosplenomegaly, hepatojugular reflux.  EXTREMITIES:  Distal pulses are intact with trace edema in the right  lower extremity.   Endoscopic vein harvest site well healed, radial artery site well  healed.  He has an excellent pulse through his ulna in the left arm with  normal capillary reflow.  Sternum is well healed.   IMPRESSION:  1. Three-vessel disease, coronary bypass surgery.  Continue aspirin      and beta-blocker.  2. Trace lower extremity edema.  Avoid salt.  Elevate legs at the end  of the day.  Can wear compression hose on his leg when he      exercises.  3. Hyperlipidemia in the setting of coronary artery disease.  Continue      Lipitor, lipid and liver profile today.  Followup labs with Dr.      Myrtie Neither practice.  4. Blood pressure and pulse, try to titrate back Lopressor to 25      b.i.d.   Again, I think it will be a good idea for [name] to see Dr. Yetta Flock in  [place].  I will send him a copy of this note.     Noralyn Pick. Eden Emms, MD, [facility]  Electronically Signed    PCN/MedQ  DD: 07/01/2008  DT: 07/01/2008  Job #: [id number]   cc:   Charlott Rakes, MD

## 2011-01-06 NOTE — Discharge Summary (Signed)
[name], [name]               ACCOUNT NO.:  [id number]   MEDICAL RECORD NO.:  [id number]          PATIENT TYPE:  INP   LOCATION:  2039                         FACILITY:  [facility]   PHYSICIAN:  Evelene Croon, M.D.     DATE OF BIRTH:  08/30/1960   DATE OF ADMISSION:  03/23/2008  DATE OF DISCHARGE:  03/28/2008                               DISCHARGE SUMMARY   HISTORY:  The patient is a 50 year old gentleman referred to Evelene Croon, MD for cardiothoracic surgical consultation due to severe 3-  vessel coronary artery disease.  The patient is noted to have a history  of hyperlipidemia as well as a positive family history of heart disease.  He reported a 6-8 month history of episodic exertionally related  substernal chest pain.  The pain would resolve with rest.  He underwent  a cardiac catheterization by Dr. Juanda Chance, which revealed severe 3-vessel  coronary artery disease.  The LAD had about a 40% proximal stenosis.  There were 3 diagonal branches.  There was about a 95% LAD stenosis in  the first diagonal branch.  There was another 95% stenosis in the  midportion beyond the third diagonal branch.  The left circumflex gave  off a moderate-sized marginal branch that had an 80% proximal stenosis.  The left circumflex then had an 80% proximal stenosis before the takeoff  of a large obtuse marginal that had an 80% proximal stenosis in it.  The  distal left circumflex gave off 3 small posterolateral branches.  The  right coronary artery was occluded proximally and filled in the distal  vessel by collaterals via the left circumflex.  The left ventricular  ejection fraction was about 60% with no wall motion abnormalities.  Due  to these findings and their severity, it was Dr. Sharee Pimple opinion that  he should undergo surgical revascularization and was admitted this  hospitalization for the procedure.   MEDICATIONS ON ADMISSION:  1. Lipitor 40 mg daily.  2. Aspirin 81 mg daily.  3. Lopressor 25  mg b.i.d.  4. Fish oil 1000 mg daily.  5. Sublingual nitroglycerin p.r.n.   PAST MEDICAL HISTORY:  Significant for hyperlipidemia.   ALLERGIES:  CODEINE causes headaches.  IODINE causes hives.   PAST SURGICAL HISTORY:  Includes a vasectomy and repair of pectus  excavatum as a child.  He also had a right carpal tunnel surgery and a  wisdom tooth removal.   FAMILY HISTORY/SOCIAL HISTORY/REVIEW OF SYSTEMS/PHYSICAL EXAMINATION:  Please see history and physical done at the time of admission.   HOSPITAL COURSE:  The patient was admitted electively and on March 23, 2008 taken to the operating room where he underwent the following  procedure.  Coronary artery bypass grafting x5.  The following grafts  were placed.  1. Left internal mammary artery to the distal LAD.  2. A saphenous vein graft to the mid LAD.  3. Saphenous vein graft to the ramus intermedius.  4. Left radial artery to the obtuse marginal.  5. Saphenous vein graft to the posterior descending.  The patient      tolerated the  procedure well and was taken to the surgical      intensive care unit in stable condition.   POSTOPERATIVE HOSPITAL COURSE:  The patient has progressed nicely.  He  has maintained stable hemodynamics.  He was weaned from the ventilator  without difficulty.  He had no significant postoperative bleeding,  although there was a postoperative acute blood loss anemia that was  quite stable.  The hemoglobin and hematocrit at time of discharge was 12  and 35 respectively.  He did drop to a low point of 10 and 29  respectively initially postop.  All routine lines, monitors, and  drainage devices were discontinued in the standard fashion.  His  incisions healed nicely without evidence of infection.  His left arm  showed no evidence of neurovascular difficulty.  He responded well to a  gentle diuresis.  He responded well to routine activity advancement  using standard protocols.  He remained in a normal sinus  rhythm without  significant cardiac ectopy or dysrhythmias.  His overall status was  deemed to be acceptable for discharge on March 28, 2008.   MEDICATIONS AT DISCHARGE:  1. Lipitor 40 mg daily.  2. Enteric-coated aspirin 325 mg daily.  3. Imdur 30 mg daily for 1 month for radial artery graft.  4. Lopressor 75 mg twice daily.  5. Ultram 50 mg 1-2 every 6 hours as needed for pain.  6. Lasix 40 mg daily for 5 days.  7. K-Dur 20 mEq daily for 5 days.   INSTRUCTIONS:  The patient received written instructions in regards to  medications, activity, diet, wound care, and followup.  Followup  included Dr. Laneta Simmers in 3 weeks post discharge and Dr. Juanda Chance 2 weeks  post discharge.   FINAL DIAGNOSIS:  Severe coronary artery disease as described, now  status post surgical revascularization, also as described.   OTHER DIAGNOSES:  1. Hyperlipidemia.  2. Postoperative acute blood loss anemia.      Rowe Clack, P.A.-C.      Evelene Croon, M.D.  Electronically Signed    WEG/MEDQ  D:  07/07/2008  T:  07/08/2008  Job:  [id number]   cc:   Everardo Beals. Juanda Chance, MD, [facility]  Bruce H. Swords, MD

## 2011-05-19 LAB — BASIC METABOLIC PANEL
BUN: 12
BUN: 6
BUN: 8
CO2: 30
Calcium: 7.6 — ABNORMAL LOW
Calcium: 9
Chloride: 100
Chloride: 105
Chloride: 105
Creatinine, Ser: 0.82
Creatinine, Ser: 0.97
Creatinine, Ser: 0.98
GFR calc Af Amer: >60
GFR calc Af Amer: >60
GFR calc non Af Amer: >60
GFR calc non Af Amer: >60
Glucose, Bld: 91
Potassium: 4

## 2011-05-19 LAB — POCT I-STAT 3, ART BLOOD GAS (G3+)
Acid-base deficit: 2
O2 Saturation: 100
O2 Saturation: 100
O2 Saturation: 99
pCO2 arterial: 36.3
pCO2 arterial: 38.9
pCO2 arterial: 39.5
pCO2 arterial: 44.9
pCO2 arterial: 49.6 — ABNORMAL HIGH
pH, Arterial: 7.285 — ABNORMAL LOW
pH, Arterial: 7.387
pH, Arterial: 7.431
pO2, Arterial: 132 — ABNORMAL HIGH
pO2, Arterial: 91

## 2011-05-19 LAB — TYPE AND SCREEN

## 2011-05-19 LAB — CBC
HCT: 49
HCT: 30.6 — ABNORMAL LOW
HCT: 31.8 — ABNORMAL LOW
HCT: 34.7 — ABNORMAL LOW
Hemoglobin: 16.6
Hemoglobin: 10.6 — ABNORMAL LOW
Hemoglobin: 11.2 — ABNORMAL LOW
MCHC: 34
MCHC: 34.2
MCV: 92.7
MCV: 93.4
MCV: 94.8
Platelets: 144 — ABNORMAL LOW
Platelets: 146 — ABNORMAL LOW
Platelets: 166
Platelets: 274
Platelets: 307
RBC: 3.18 — ABNORMAL LOW
RBC: 3.31 — ABNORMAL LOW
RBC: 3.4 — ABNORMAL LOW
RBC: 3.56 — ABNORMAL LOW
RBC: 4.03 — ABNORMAL LOW
RDW: 12.4
RDW: 12
RDW: 12.1
RDW: 12.3
RDW: 12.5
WBC: 15.6 — ABNORMAL HIGH
WBC: 16 — ABNORMAL HIGH
WBC: 16.5 — ABNORMAL HIGH
WBC: 17.9 — ABNORMAL HIGH

## 2011-05-19 LAB — URINE CULTURE
Colony Count: NO GROWTH
Culture: NO GROWTH

## 2011-05-19 LAB — BLOOD GAS, ARTERIAL
Acid-Base Excess: 4.2 — ABNORMAL HIGH
Bicarbonate: 28.1 — ABNORMAL HIGH
Patient temperature: 98.6
TCO2: 29.3
pH, Arterial: 7.45

## 2011-05-19 LAB — URINALYSIS, ROUTINE W REFLEX MICROSCOPIC
Bilirubin Urine: NEGATIVE
Bilirubin Urine: NEGATIVE
Glucose, UA: NEGATIVE
Hgb urine dipstick: NEGATIVE
Ketones, ur: NEGATIVE
Ketones, ur: NEGATIVE
Nitrite: NEGATIVE
Protein, ur: NEGATIVE
Urobilinogen, UA: 0.2
pH: 7

## 2011-05-19 LAB — CREATININE, SERUM
Creatinine, Ser: 0.85
Creatinine, Ser: 0.89
GFR calc Af Amer: >60
GFR calc non Af Amer: >60
GFR calc non Af Amer: >60

## 2011-05-19 LAB — POCT I-STAT 4, (NA,K, GLUC, HGB,HCT)
Glucose, Bld: 102 — ABNORMAL HIGH
Glucose, Bld: 95
Glucose, Bld: 95
HCT: 30 — ABNORMAL LOW
HCT: 33 — ABNORMAL LOW
HCT: 43
Hemoglobin: 10.2 — ABNORMAL LOW
Hemoglobin: 11.2 — ABNORMAL LOW
Hemoglobin: 9.2 — ABNORMAL LOW
Potassium: 4
Potassium: 4
Potassium: 4.7
Sodium: 133 — ABNORMAL LOW
Sodium: 140

## 2011-05-19 LAB — MAGNESIUM
Magnesium: 2.3
Magnesium: 2.4

## 2011-05-19 LAB — POCT I-STAT 3, VENOUS BLOOD GAS (G3P V)
Acid-Base Excess: 1
O2 Saturation: 86
pO2, Ven: 54 — ABNORMAL HIGH

## 2011-05-19 LAB — POCT I-STAT, CHEM 8
Calcium, Ion: 1.14
Chloride: 100
Chloride: 107
Glucose, Bld: 105 — ABNORMAL HIGH
Glucose, Bld: 109 — ABNORMAL HIGH
HCT: 30 — ABNORMAL LOW
HCT: 31 — ABNORMAL LOW
Hemoglobin: 10.2 — ABNORMAL LOW
Hemoglobin: 10.5 — ABNORMAL LOW
Potassium: 3.6
TCO2: 22

## 2011-05-19 LAB — COMPREHENSIVE METABOLIC PANEL
Alkaline Phosphatase: 99
BUN: 11
Chloride: 100
Glucose, Bld: 92
Potassium: 3.9
Total Bilirubin: 0.9
Total Protein: 6.7

## 2011-05-19 LAB — PLATELET COUNT: Platelets: 193

## 2011-05-19 LAB — HEMOGLOBIN A1C
Hgb A1c MFr Bld: 5.7
Mean Plasma Glucose: 126

## 2011-05-19 LAB — ABO/RH: ABO/RH(D): O POS

## 2011-05-19 LAB — POCT I-STAT GLUCOSE: Glucose, Bld: 104 — ABNORMAL HIGH

## 2011-07-10 ENCOUNTER — Encounter: Payer: Self-pay | Admitting: Physician Assistant

## 2011-07-10 ENCOUNTER — Encounter: Payer: Self-pay | Admitting: *Deleted

## 2011-07-12 ENCOUNTER — Encounter: Payer: Self-pay | Admitting: Cardiovascular Disease

## 2011-07-12 ENCOUNTER — Ambulatory Visit (INDEPENDENT_AMBULATORY_CARE_PROVIDER_SITE_OTHER): Payer: BC Managed Care – PPO | Admitting: Cardiovascular Disease

## 2011-07-12 VITALS — BP 132/88 | HR 56 | Ht 70.0 in | Wt 195.0 lb

## 2011-07-12 DIAGNOSIS — Z951 Presence of aortocoronary bypass graft: Secondary | ICD-10-CM

## 2011-07-12 DIAGNOSIS — R000 Tachycardia, unspecified: Secondary | ICD-10-CM

## 2011-07-12 DIAGNOSIS — E785 Hyperlipidemia, unspecified: Secondary | ICD-10-CM

## 2011-07-12 DIAGNOSIS — R079 Chest pain, unspecified: Secondary | ICD-10-CM

## 2011-07-12 NOTE — Assessment & Plan Note (Signed)
Resolved continue beta blocker with history of CABG and CAD

## 2011-07-12 NOTE — Assessment & Plan Note (Signed)
Stable with no angina and good activity level.  Continue medical Rx  

## 2011-07-12 NOTE — Patient Instructions (Signed)
Your physician wants you to follow-up in: one year You will receive a reminder letter in the mail two months in advance. If you don't receive a letter, please call our office to schedule the follow-up appointment.  

## 2011-07-12 NOTE — Assessment & Plan Note (Signed)
Cholesterol is at goal.  Continue current dose of statin and diet Rx.  No myalgias or side effects.  F/U  LFT's in 6 months. Lab Results  Component Value Date   LDLCALC 116* 07/01/2008   Followed by primary

## 2011-07-12 NOTE — Progress Notes (Signed)
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 His angina is resolved. He is back to work as a Runner, broadcasting/film/video in [employer]. He has been compliant with his meds. [facility] is seeing [name] and they are following his lipids. His triglycerides were elevated and he is now on Trilipix. He is active and not having any problems. His right endoscopic harvest site and chest have healed nicely No paresthesias or issues with the left arm after radial harvest. Working out 3x/week.    ROS: Denies fever, malais, weight loss, blurry vision, decreased visual acuity, cough, sputum, SOB, hemoptysis, pleuritic pain, palpitaitons, heartburn, abdominal pain, melena, lower extremity edema, claudication, or rash.  All other systems reviewed and negative  General: Affect appropriate Healthy:  appears stated age HEENT: normal Neck supple with no adenopathy JVP normal no bruits no thyromegaly Lungs clear with no wheezing and good diaphragmatic motion Heart:  S1/S2 no murmur,rub, gallop or click PMI normal Abdomen: benighn, BS positve, no tenderness, no AAA no bruit.  No HSM or HJR Distal pulses intact with no bruits No edema Neuro non-focal Skin warm and dry No muscular weakness   Current Outpatient Prescriptions  Medication Sig Dispense Refill  . aspirin 325 MG tablet Take 325 mg by mouth daily.        . Choline Fenofibrate (TRILIPIX) 135 MG capsule Take 135 mg by mouth daily.        . fish oil-omega-3 fatty acids 1000 MG capsule Take 1 g by mouth 3 (three) times daily.        . metoprolol tartrate (LOPRESSOR) 25 MG tablet Take 25 mg by mouth 2 (two) times daily.        . Multiple Vitamins-Minerals (CENTRUM SILVER PO) Take 1 tablet by mouth daily.        . rosuvastatin (CRESTOR) 40 MG tablet Take 40 mg by mouth daily.          Allergies  Codeine sulfate and Oxycodone-acetaminophen  Electrocardiogram:  NSR 56 RAD ICRBBB  Assessment and Plan

## 2011-07-14 ENCOUNTER — Ambulatory Visit: Payer: Self-pay | Admitting: Cardiovascular Disease

## 2011-11-20 ENCOUNTER — Other Ambulatory Visit: Payer: Self-pay | Admitting: Cardiovascular Disease

## 2011-11-21 ENCOUNTER — Other Ambulatory Visit: Payer: Self-pay | Admitting: *Deleted

## 2011-11-21 MED ORDER — METOPROLOL TARTRATE 25 MG PO TABS: 25.0000 mg | ORAL_TABLET | Freq: Two times a day (BID) | ORAL | Status: DC

## 2012-02-01 ENCOUNTER — Encounter: Payer: Self-pay | Admitting: Cardiovascular Disease

## 2012-07-01 ENCOUNTER — Ambulatory Visit (INDEPENDENT_AMBULATORY_CARE_PROVIDER_SITE_OTHER): Payer: BC Managed Care – PPO | Admitting: Cardiovascular Disease

## 2012-07-01 ENCOUNTER — Encounter: Payer: Self-pay | Admitting: Cardiovascular Disease

## 2012-07-01 VITALS — BP 127/74 | HR 55 | Ht 70.0 in | Wt 186.0 lb

## 2012-07-01 DIAGNOSIS — I25810 Atherosclerosis of coronary artery bypass graft(s) without angina pectoris: Secondary | ICD-10-CM

## 2012-07-01 NOTE — Assessment & Plan Note (Signed)
Stable with no angina and good activity level.  Continue medical Rx  

## 2012-07-01 NOTE — Assessment & Plan Note (Signed)
Reasonable control but on 3 drugs  Will ask Pharm D about HDL raising trials

## 2012-07-01 NOTE — Progress Notes (Signed)
Patient ID: [name], male   DOB: 01-13-1961, 51 y.o.   MRN: [id number] [name] is seen today for F/U of CAD with CABG in July 2010. His angina is resolved. He is back to work as a Runner, broadcasting/film/video in [employer]. He has been compliant with his meds. He is seeing [name] practice and they are following his lipids. His triglycerides were elevated and he is now on Trilipix. He is active and not having any problems. His right endoscopic harvest site and chest have healed nicely No paresthesias or issues with the left arm after radial harvest. Working out 3x/week. [name] is primary   Lipids:  10/13  TC 166 Tri 95 HDL 38 LDL 109  ROS: Denies fever, malais, weight loss, blurry vision, decreased visual acuity, cough, sputum, SOB, hemoptysis, pleuritic pain, palpitaitons, heartburn, abdominal pain, melena, lower extremity edema, claudication, or rash.  All other systems reviewed and negative  General: Affect appropriate Healthy:  appears stated age HEENT: normal Neck supple with no adenopathy JVP normal no bruits no thyromegaly Lungs clear with no wheezing and good diaphragmatic motion Heart:  S1/S2 no murmur, no rub, gallop or click PMI normal Abdomen: benighn, BS positve, no tenderness, no AAA no bruit.  No HSM or HJR Distal pulses intact with no bruits left radial harvested No edema Neuro non-focal Skin warm and dry No muscular weakness   Current Outpatient Prescriptions  Medication Sig Dispense Refill  . aspirin 325 MG tablet Take 325 mg by mouth daily.        . Choline Fenofibrate (TRILIPIX) 135 MG capsule Take 135 mg by mouth daily.        . fish oil-omega-3 fatty acids 1000 MG capsule Take 1 g by mouth 3 (three) times daily.        . metoprolol tartrate (LOPRESSOR) 25 MG tablet Take 1 tablet (25 mg total) by mouth 2 (two) times daily.  60 tablet  12  . Multiple Vitamins-Minerals (CENTRUM SILVER PO) Take 1 tablet by mouth daily.        . rosuvastatin (CRESTOR) 40 MG tablet Take 40  mg by mouth daily.        Marland Kitchen ZETIA 10 MG tablet Take 1 tablet by mouth Daily.        Allergies  Codeine sulfate and Oxycodone-acetaminophen  Electrocardiogram: NSR rate 55 ICRBBB PR 238    Assessment and Plan

## 2012-07-01 NOTE — Patient Instructions (Signed)
Your physician wants you to follow-up in: YEAR WITH DR NISHAN  You will receive a reminder letter in the mail two months in advance. If you don't receive a letter, please call our office to schedule the follow-up appointment.  Your physician recommends that you continue on your current medications as directed. Please refer to the Current Medication list given to you today. 

## 2012-12-11 ENCOUNTER — Telehealth: Payer: Self-pay | Admitting: Cardiovascular Disease

## 2012-12-11 MED ORDER — METOPROLOL TARTRATE 25 MG PO TABS: 25.0000 mg | ORAL_TABLET | Freq: Two times a day (BID) | ORAL | Status: DC

## 2012-12-11 NOTE — Telephone Encounter (Signed)
New Problme:    Called in needing a refill of the patient's metoprolol tartrate (LOPRESSOR) 25 MG tablet.  Please call back.

## 2013-07-01 ENCOUNTER — Encounter (INDEPENDENT_AMBULATORY_CARE_PROVIDER_SITE_OTHER): Payer: Self-pay

## 2013-07-01 ENCOUNTER — Encounter: Payer: Self-pay | Admitting: Cardiovascular Disease

## 2013-07-01 ENCOUNTER — Ambulatory Visit (INDEPENDENT_AMBULATORY_CARE_PROVIDER_SITE_OTHER): Payer: BC Managed Care – PPO | Admitting: Cardiovascular Disease

## 2013-07-01 VITALS — BP 128/82 | HR 51 | Ht 70.0 in | Wt 181.8 lb

## 2013-07-01 DIAGNOSIS — I2510 Atherosclerotic heart disease of native coronary artery without angina pectoris: Secondary | ICD-10-CM

## 2013-07-01 DIAGNOSIS — Z951 Presence of aortocoronary bypass graft: Secondary | ICD-10-CM

## 2013-07-01 DIAGNOSIS — E785 Hyperlipidemia, unspecified: Secondary | ICD-10-CM

## 2013-07-01 DIAGNOSIS — I25810 Atherosclerosis of coronary artery bypass graft(s) without angina pectoris: Secondary | ICD-10-CM

## 2013-07-01 HISTORY — DX: Atherosclerotic heart disease of native coronary artery without angina pectoris: I25.10

## 2013-07-01 NOTE — Assessment & Plan Note (Signed)
Cholesterol is at goal.  Continue current dose of statin and diet Rx.  No myalgias or side effects.  F/U  LFT's in 6 months. Lab Results  Component Value Date   LDLCALC 116* 07/01/2008   See labs in HPI.  Discussed trying to simplify by stopping Zetia

## 2013-07-01 NOTE — Assessment & Plan Note (Signed)
Stable with no angina and good activity level.  Continue medical Rx Discussed watching HR as he tends to be bradycardic May need to lower Beta blocker in future

## 2013-07-01 NOTE — Patient Instructions (Signed)
Your physician wants you to follow-up in: YEAR WITH DR NISHAN  You will receive a reminder letter in the mail two months in advance. If you don't receive a letter, please call our office to schedule the follow-up appointment.  Your physician recommends that you continue on your current medications as directed. Please refer to the Current Medication list given to you today. 

## 2013-07-01 NOTE — Progress Notes (Signed)
Patient ID: [name], male   DOB: 17-Oct-1960, 52 y.o.   MRN: [id number] Ed is seen today for F/U of CAD with CABG in July 2010. His angina is resolved. He is back to work as a Runner, broadcasting/film/video in [employer]. He has been compliant with his meds. He is seeing [name] and they are following his lipids. His triglycerides were elevated and he is now on Trilipix. He is active and not having any problems. His right endoscopic harvest site and chest have healed nicely No paresthesias or issues with the left arm after radial harvest. Working out 3x/week. [name] is primary  Lipids: 10/13 TC 166 Tri 95 HDL 38 LDL 109 Lipids: 10/14 TC 143 TGri 72 HDL 46 LDL 83    ROS: Denies fever, malais, weight loss, blurry vision, decreased visual acuity, cough, sputum, SOB, hemoptysis, pleuritic pain, palpitaitons, heartburn, abdominal pain, melena, lower extremity edema, claudication, or rash.  All other systems reviewed and negative  General: Affect appropriate Healthy:  appears stated age HEENT: normal Neck supple with no adenopathy JVP normal no bruits no thyromegaly Lungs clear with no wheezing and good diaphragmatic motion Heart:  S1/S2 no murmur, no rub, gallop or click PMI normal Abdomen: benighn, BS positve, no tenderness, no AAA no bruit.  No HSM or HJR Distal pulses intact with no bruits No edema Neuro non-focal Skin warm and dry No muscular weakness   Current Outpatient Prescriptions  Medication Sig Dispense Refill  . aspirin 325 MG tablet Take 325 mg by mouth daily.        . Choline Fenofibrate (TRILIPIX) 135 MG capsule Take 135 mg by mouth daily.        . fish oil-omega-3 fatty acids 1000 MG capsule Take 1 g by mouth 3 (three) times daily.        . metoprolol tartrate (LOPRESSOR) 25 MG tablet Take 1 tablet (25 mg total) by mouth 2 (two) times daily.  60 tablet  9  . Multiple Vitamins-Minerals (CENTRUM SILVER PO) Take 1 tablet by mouth daily.        . rosuvastatin (CRESTOR)  40 MG tablet Take 40 mg by mouth daily.        Marland Kitchen ZETIA 10 MG tablet Take 1 tablet by mouth Daily.       No current facility-administered medications for this visit.    Allergies  Codeine sulfate and Oxycodone-acetaminophen  Electrocardiogram: 07/01/12  SR rate 55 PR 238 ICRBBB  Today SB rate 55 PR 230 ICRBBB no changes   Assessment and Plan

## 2013-07-06 ENCOUNTER — Encounter: Payer: Self-pay | Admitting: Cardiovascular Disease

## 2013-07-09 ENCOUNTER — Other Ambulatory Visit: Payer: Self-pay | Admitting: *Deleted

## 2013-10-07 ENCOUNTER — Other Ambulatory Visit: Payer: Self-pay | Admitting: Cardiovascular Disease

## 2014-03-24 ENCOUNTER — Encounter: Payer: Self-pay | Admitting: Cardiovascular Disease

## 2014-08-13 NOTE — Progress Notes (Signed)
Patient ID: [name], male   DOB: 07/25/1961, 53 y.o.   MRN: [id number] Ed is seen today for F/U of CAD with CABG in July 2010. His angina is resolved. 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 He has been compliant with his meds. [facility] and [facility] are following his lipids. His triglycerides were elevated and he is now on Trilipix. He is active and not having any problems. His right endoscopic harvest site and chest have healed nicely No paresthesias or issues with the left arm after radial harvest. Working out 3x/week. 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  All other systems reviewed and negative  General: Affect appropriate Healthy:  appears stated age HEENT: normal Neck supple with no adenopathy JVP normal no bruits no thyromegaly Lungs clear with no wheezing and good diaphragmatic motion Heart:  S1/S2 no murmur, no rub, gallop or click PMI normal Abdomen: benighn, BS positve, no tenderness, no AAA no bruit.  No HSM or HJR Distal pulses intact with no bruits No edema Neuro non-focal Skin warm and dry No muscular weakness   Current Outpatient Prescriptions  Medication Sig Dispense Refill  . aspirin 325 MG tablet Take 325 mg by mouth daily.      . Choline Fenofibrate (TRILIPIX) 135 MG capsule Take 135 mg by mouth daily.      . fish oil-omega-3 fatty acids 1000 MG capsule Take 1 g by mouth 3 (three) times daily.      . metoprolol tartrate (LOPRESSOR) 25 MG tablet Take 1 tablet (25 mg total) by mouth 2 (two) times daily. 60 tablet 10  . Multiple Vitamins-Minerals  (CENTRUM SILVER PO) Take 1 tablet by mouth daily.      . niacin (NIASPAN) 750 MG CR tablet Take 750 mg by mouth at bedtime.     . rosuvastatin (CRESTOR) 40 MG tablet Take 40 mg by mouth daily.       No current facility-administered medications for this visit.    Allergies  Codeine sulfate and Oxycodone-acetaminophen  Electrocardiogram:  07/01/12 SR rate 55 PR 238 ICRBBB 11/14  SB rate 55 PR 230 ICRBBB no changes  Today SR rate 54 RSR'   Assessment and Plan

## 2014-08-17 ENCOUNTER — Encounter: Payer: Self-pay | Admitting: Cardiovascular Disease

## 2014-08-17 ENCOUNTER — Ambulatory Visit (INDEPENDENT_AMBULATORY_CARE_PROVIDER_SITE_OTHER): Payer: BC Managed Care – PPO | Admitting: Cardiovascular Disease

## 2014-08-17 VITALS — BP 122/80 | HR 54 | Ht 70.0 in | Wt 186.0 lb

## 2014-08-17 DIAGNOSIS — I25810 Atherosclerosis of coronary artery bypass graft(s) without angina pectoris: Secondary | ICD-10-CM

## 2014-08-17 MED ORDER — NITROGLYCERIN 0.4 MG SL SUBL: 0.4000 mg | SUBLINGUAL_TABLET | SUBLINGUAL | Status: DC | PRN

## 2014-08-17 NOTE — Assessment & Plan Note (Signed)
Stable with no angina and good activity level.  Continue medical Rx Nitro called in 

## 2014-08-17 NOTE — Assessment & Plan Note (Signed)
Cholesterol is at goal.  Continue current dose of statin and diet Rx.  No myalgias or side effects.  F/U  LFT's in 6 months. Lab Results  Component Value Date   LDLCALC 116* 07/01/2008  See table in HPI  F/u [place]

## 2014-08-17 NOTE — Patient Instructions (Signed)
Your physician wants you to follow-up in: 1 year with Dr. Nishan You will receive a reminder letter in the mail two months in advance. If you don't receive a letter, please call our office to schedule the follow-up appointment.  Your physician recommends that you continue on your current medications as directed. Please refer to the Current Medication list given to you today.  

## 2014-08-19 ENCOUNTER — Encounter: Payer: Self-pay | Admitting: Cardiovascular Disease

## 2014-08-31 ENCOUNTER — Other Ambulatory Visit: Payer: Self-pay | Admitting: Cardiovascular Disease

## 2015-07-29 ENCOUNTER — Other Ambulatory Visit: Payer: Self-pay | Admitting: *Deleted

## 2015-07-29 MED ORDER — METOPROLOL TARTRATE 25 MG PO TABS
25.0000 mg | ORAL_TABLET | Freq: Two times a day (BID) | ORAL | Status: DC
Start: 2015-07-29 — End: 2015-08-18

## 2015-08-09 ENCOUNTER — Encounter: Payer: Self-pay | Admitting: Cardiovascular Disease

## 2015-08-17 NOTE — Progress Notes (Signed)
Patient ID: [name], male   DOB: 03/30/1961, 54 y.o.   MRN: [id number] [name] is seen today for F/U of CAD with CABG in July 2010. His angina is resolved. 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 He has been compliant with his meds. [facility] and [facility] are following his lipids. His triglycerides were elevated and he is now on Trilipix. He is active and not having any problems. His right endoscopic harvest site and chest have healed nicely No paresthesias or issues with the left arm after radial harvest. Working out 3x/week. 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  All other systems reviewed and negative  General: Affect appropriate Healthy:  appears stated age HEENT: normal Neck supple with no adenopathy JVP normal no bruits no thyromegaly Lungs clear with no wheezing and good diaphragmatic motion Heart:  S1/S2 no murmur, no rub, gallop or click PMI normal Abdomen: benighn, BS positve, no tenderness, no AAA no bruit.  No HSM or HJR Distal pulses intact with no bruits No edema Neuro non-focal Skin warm and dry No muscular weakness   Current Outpatient Prescriptions  Medication Sig Dispense Refill  . aspirin 325 MG tablet Take 325 mg by mouth daily.      . Choline Fenofibrate (TRILIPIX) 135 MG capsule Take 135 mg by mouth daily.      . fish oil-omega-3 fatty acids 1000 MG capsule Take 1 g by mouth 3 (three) times daily.      . metoprolol tartrate (LOPRESSOR) 25 MG tablet Take 1 tablet (25 mg total) by mouth 2 (two) times daily. 60 tablet 0  . Multiple Vitamins-Minerals  (CENTRUM SILVER PO) Take 1 tablet by mouth daily.      . niacin (NIASPAN) 750 MG CR tablet Take 750 mg by mouth at bedtime.     . nitroGLYCERIN (NITROSTAT) 0.4 MG SL tablet Place 0.4 mg under the tongue every 5 (five) minutes as needed for chest pain (3 doses max).    . rosuvastatin (CRESTOR) 40 MG tablet Take 40 mg by mouth daily.       No current facility-administered medications for this visit.    Allergies  Codeine sulfate and Oxycodone-acetaminophen  Electrocardiogram:  07/01/12 SR rate 55 PR 238 ICRBBB 11/14  SB rate 55 PR 230 ICRBBB no changes  08/17/14  SR rate 54 RSR'  08/18/15  SR rate 58 PR 242  Otherwise normal   Assessment and Plan CAD/CABG: 2010   Will plan on ETT next visit   Cholesterol:  Cholesterol is at goal.  Continue current dose of statin and diet Rx.  No myalgias or side effects.  F/U  LFT's in 6 months. Lab Results  Component Value Date   LDLCALC 116* 07/01/2008            l[place] [facility] [place]  First Degree Block:  Mild f/u yearly ECG   Charlton HawsPeter Ilijah Doucet

## 2015-08-18 ENCOUNTER — Ambulatory Visit (INDEPENDENT_AMBULATORY_CARE_PROVIDER_SITE_OTHER): Payer: BC Managed Care – PPO | Admitting: Cardiovascular Disease

## 2015-08-18 ENCOUNTER — Encounter: Payer: Self-pay | Admitting: Cardiovascular Disease

## 2015-08-18 VITALS — BP 120/78 | HR 58 | Ht 70.0 in | Wt 192.8 lb

## 2015-08-18 DIAGNOSIS — I25810 Atherosclerosis of coronary artery bypass graft(s) without angina pectoris: Secondary | ICD-10-CM

## 2015-08-18 MED ORDER — METOPROLOL TARTRATE 25 MG PO TABS: 25.0000 mg | ORAL_TABLET | Freq: Two times a day (BID) | ORAL | Status: DC

## 2015-08-18 MED ORDER — NITROGLYCERIN 0.4 MG SL SUBL: 0.4000 mg | SUBLINGUAL_TABLET | SUBLINGUAL | Status: DC | PRN

## 2015-08-18 NOTE — Patient Instructions (Addendum)
Medication Instructions:  Your physician recommends that you continue on your current medications as directed. Please refer to the Current Medication list given to you today.   Labwork: None ordered   Testing/Procedures: Your physician has requested that you have an exercise tolerance test in 1 year.  For further information please visit [email or web address]. Please also follow instruction sheet, as given.    Follow-Up: Your physician wants you to follow-up in:  1 YEAR WITH DR. Eden EmmsNISHAN.  You will receive a reminder letter in the mail two months in advance. If you don't receive a letter, please call our office to schedule the follow-up appointment.   Any Other Special Instructions Will Be Listed Below (If Applicable).     If you need a refill on your cardiac medications before your next appointment, please call your pharmacy.

## 2016-07-27 ENCOUNTER — Encounter: Payer: Self-pay | Admitting: Cardiovascular Disease

## 2016-08-16 ENCOUNTER — Ambulatory Visit (INDEPENDENT_AMBULATORY_CARE_PROVIDER_SITE_OTHER): Payer: BC Managed Care – PPO

## 2016-08-16 DIAGNOSIS — I25810 Atherosclerosis of coronary artery bypass graft(s) without angina pectoris: Secondary | ICD-10-CM

## 2016-08-16 LAB — EXERCISE TOLERANCE TEST
CHL CUP MPHR: 164 {beats}/min
CHL CUP STRESS STAGE 1 GRADE: 0 %
CHL CUP STRESS STAGE 1 SBP: 129 mmHg
CHL CUP STRESS STAGE 1 SPEED: 0 mph
CHL CUP STRESS STAGE 2 GRADE: 0 %
CHL CUP STRESS STAGE 2 HR: 78 {beats}/min
CHL CUP STRESS STAGE 3 GRADE: 0 %
CHL CUP STRESS STAGE 3 HR: 78 {beats}/min
CHL CUP STRESS STAGE 4 HR: 90 {beats}/min
CHL CUP STRESS STAGE 5 GRADE: 12 %
CHL CUP STRESS STAGE 5 SPEED: 2.5 mph
CHL CUP STRESS STAGE 6 HR: 127 {beats}/min
CHL CUP STRESS STAGE 6 SBP: 192 mmHg
CHL CUP STRESS STAGE 6 SPEED: 3.4 mph
CHL CUP STRESS STAGE 7 GRADE: 16 %
CHL CUP STRESS STAGE 7 SPEED: 4.2 mph
CHL CUP STRESS STAGE 8 DBP: 74 mmHg
CHL CUP STRESS STAGE 8 HR: 116 {beats}/min
CHL CUP STRESS STAGE 9 GRADE: 0 %
CHL CUP STRESS STAGE 9 SPEED: 0 mph
CSEPPMHR: 85 %
Estimated workload: 11.5 METS
Exercise duration (min): 9 min
Exercise duration (sec): 52 s
Peak HR: 141 {beats}/min
Percent HR: 86 %
RPE: 14
Rest HR: 63 {beats}/min
Stage 1 DBP: 87 mmHg
Stage 1 HR: 80 {beats}/min
Stage 2 Speed: 1 mph
Stage 3 Speed: 1 mph
Stage 4 DBP: 79 mmHg
Stage 4 Grade: 10 %
Stage 4 SBP: 164 mmHg
Stage 4 Speed: 1.7 mph
Stage 5 DBP: 80 mmHg
Stage 5 HR: 110 {beats}/min
Stage 5 SBP: 174 mmHg
Stage 6 DBP: 77 mmHg
Stage 6 Grade: 14 %
Stage 7 HR: 141 {beats}/min
Stage 8 Grade: 0 %
Stage 8 SBP: 170 mmHg
Stage 8 Speed: 0 mph
Stage 9 DBP: 78 mmHg
Stage 9 HR: 91 {beats}/min
Stage 9 SBP: 136 mmHg

## 2016-08-23 ENCOUNTER — Other Ambulatory Visit: Payer: Self-pay | Admitting: Cardiovascular Disease

## 2016-09-20 ENCOUNTER — Other Ambulatory Visit: Payer: Self-pay | Admitting: Cardiovascular Disease

## 2016-09-25 ENCOUNTER — Telehealth: Payer: Self-pay | Admitting: Cardiovascular Disease

## 2016-09-25 MED ORDER — METOPROLOL TARTRATE 25 MG PO TABS: 25.0000 mg | ORAL_TABLET | Freq: Two times a day (BID) | ORAL | 0 refills | Status: DC

## 2016-09-25 NOTE — Telephone Encounter (Signed)
Patient has an appointment on November 24, 2016 with Dr. Eden EmmsNishan. Sent in refill for metoprolol to cover patient until he sees Dr. Eden EmmsNishan.

## 2016-09-25 NOTE — Telephone Encounter (Signed)
New Message    Pt was in for stress test, why does that not count as his yearly visit? He needs his prescriptions filled, he only has 2 weeks left.    Leave complete message on voicemail or leave message with his wife. 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

## 2016-11-22 NOTE — Progress Notes (Signed)
Patient ID: [name], male   DOB: 12-Aug-1961, 56 y.o.   MRN: [id number]   [name] is seen today for F/U of CAD with CABG in July 2010. His angina is resolved. He is back to work as a Runner, broadcasting/film/video in [employer]. He has been compliant with his meds. He is seeing [name] practice and they are following his lipids. His triglycerides were elevated and he is now on Trilipix. He is active and not having any problems. His right endoscopic harvest site and chest have healed nicely No paresthesias or issues with the left arm after radial harvest. Working out 3x/week. 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  All other systems reviewed and negative  General: Affect appropriate Healthy:  appears stated age HEENT: normal Neck supple with no adenopathy JVP normal no bruits no thyromegaly Lungs clear with no wheezing and good diaphragmatic motion Heart:  S1/S2 no murmur, no rub, gallop or click PMI normal Abdomen: benighn, BS positve, no tenderness, no AAA no bruit.  No HSM or HJR Distal pulses intact with no bruits No edema Neuro non-focal Skin warm and dry No muscular weakness   Current Outpatient Prescriptions  Medication Sig Dispense Refill  . aspirin 325 MG tablet Take 325 mg by mouth daily.      . Choline Fenofibrate (TRILIPIX) 135 MG capsule Take 135 mg by mouth daily.      . fish oil-omega-3 fatty acids 1000 MG capsule Take 1 g by mouth 3 (three) times daily.      . metoprolol tartrate (LOPRESSOR) 25 MG tablet Take 1 tablet (25 mg total) by mouth 2 (two) times daily. 180 tablet 3  . Multiple Vitamins-Minerals (CENTRUM SILVER PO) Take 1 tablet by mouth daily.      . niacin (NIASPAN) 500 MG CR  tablet Take 500 mg by mouth 2 (two) times daily.    . nitroGLYCERIN (NITROSTAT) 0.4 MG SL tablet Place 1 tablet (0.4 mg total) under the tongue every 5 (five) minutes as needed for chest pain (3 doses max). 25 tablet 1  . rosuvastatin (CRESTOR) 40 MG tablet Take 40 mg by mouth daily.       No current facility-administered medications for this visit.     Allergies  Codeine sulfate and Oxycodone-acetaminophen  Electrocardiogram:  07/01/12 SR rate 55 PR 238 ICRBBB 11/14  SB rate 55 PR 230 ICRBBB no changes  08/17/14  SR rate 54 RSR'  08/18/15  SR rate 58 PR 242  Otherwise normal  11/24/16  SR rate 59 PR 210 RAD   Assessment and Plan  CAD/CABG: 2010   Cholesterol:  LDL 85 continue statin labs with Dr [name]   First Degree Block:  Mild f/u yearly ECG no change on ECG today    [name]

## 2016-11-24 ENCOUNTER — Ambulatory Visit (INDEPENDENT_AMBULATORY_CARE_PROVIDER_SITE_OTHER): Payer: BC Managed Care – PPO | Admitting: Cardiovascular Disease

## 2016-11-24 ENCOUNTER — Encounter: Payer: Self-pay | Admitting: Cardiovascular Disease

## 2016-11-24 VITALS — BP 110/80 | HR 66 | Ht 70.0 in | Wt 190.8 lb

## 2016-11-24 DIAGNOSIS — I25810 Atherosclerosis of coronary artery bypass graft(s) without angina pectoris: Secondary | ICD-10-CM | POA: Diagnosis not present

## 2016-11-24 MED ORDER — METOPROLOL TARTRATE 25 MG PO TABS: 25.0000 mg | ORAL_TABLET | Freq: Two times a day (BID) | ORAL | 3 refills | Status: DC

## 2016-11-24 NOTE — Patient Instructions (Addendum)

## 2017-11-15 ENCOUNTER — Other Ambulatory Visit: Payer: Self-pay | Admitting: Cardiovascular Disease

## 2017-11-26 NOTE — Progress Notes (Signed)
Patient ID: [name], male   DOB: 03/22/1961, 57 y.o.   MRN: [id number]   57 y.o. with history of CABG 03/23/2008  [place] Compliant with meds. No angina. Had left radial harvest and SVG;s Last ETT 08/16/16 reviewed and normal [facility] and LDL has been at goal with normal LFTls   CABG [place] to LAD Left radial to OM SVG mid LAD SVG IM SVG RCA   Nothing special planned for next 6 months No angina Compliant with meds    ROS: Denies fever, malais, weight loss, blurry vision, decreased visual acuity, cough, sputum, SOB, hemoptysis, pleuritic pain, palpitaitons, heartburn, abdominal pain, melena, lower extremity edema, claudication, or rash.  All other systems reviewed and negative  General: BP 122/80   Pulse 60   Ht 5\' 10"  (1.778 m)   Wt 194 lb (88 kg)   BMI 27.84 kg/m  Affect appropriate Healthy:  appears stated age HEENT: normal Neck supple with no adenopathy JVP normal no bruits no thyromegaly Lungs clear with no wheezing and good diaphragmatic motion Heart:  S1/S2 no murmur, no rub, gallop or click PMI normal post sternotomy  Abdomen: benighn, BS positve, no tenderness, no AAA no bruit.  No HSM or HJR Distal pulses intact with no bruits No edema Neuro non-focal Skin warm and dry No muscular weakness Left radial harvest Right SVG harvest LE    Current Outpatient Medications  Medication Sig Dispense Refill  . aspirin 325 MG tablet Take 325 mg by mouth daily.      Marland Kitchen. ezetimibe (ZETIA) 10 MG tablet Take 10 mg by mouth daily.    . fish oil-omega-3 fatty acids 1000 MG capsule Take 1 g by mouth 3 (three) times daily.      . metoprolol tartrate (LOPRESSOR) 25 MG tablet Take 1 tablet (25 mg total) by mouth 2 (two) times daily. 180 tablet 3  . Multiple Vitamins-Minerals (CENTRUM SILVER PO) Take 1 tablet by mouth daily.      . niacin (NIASPAN) 500 MG CR tablet Take 500 mg by mouth 2 (two) times daily.    . nitroGLYCERIN  (NITROSTAT) 0.4 MG SL tablet Place 1 tablet (0.4 mg total) under the tongue every 5 (five) minutes as needed for chest pain (3 doses max). 25 tablet 1  . rosuvastatin (CRESTOR) 40 MG tablet Take 40 mg by mouth daily.       No current facility-administered medications for this visit.     Allergies  Codeine sulfate and Oxycodone-acetaminophen  Electrocardiogram:  12/04/17 SR rate 60 PR 232 normal otherwise  Assessment and Plan  CAD/CABG: 2010 stable no angina continue medical Rx Two arterial conduits   Cholesterol:  LDL at goal  continue statin labs with Dr [facility]ges   First Degree Block:  Mild f/u yearly ECG  Stable 232 msec today    Charlton HawsPeter Olajuwon Fosdick

## 2017-11-28 ENCOUNTER — Encounter: Payer: Self-pay | Admitting: Cardiovascular Disease

## 2017-12-04 ENCOUNTER — Ambulatory Visit: Payer: BC Managed Care – PPO | Admitting: Cardiovascular Disease

## 2017-12-04 ENCOUNTER — Encounter: Payer: Self-pay | Admitting: Cardiovascular Disease

## 2017-12-04 VITALS — BP 122/80 | HR 60 | Ht 70.0 in | Wt 194.0 lb

## 2017-12-04 DIAGNOSIS — I25810 Atherosclerosis of coronary artery bypass graft(s) without angina pectoris: Secondary | ICD-10-CM

## 2017-12-04 MED ORDER — METOPROLOL TARTRATE 25 MG PO TABS: 25.0000 mg | ORAL_TABLET | Freq: Two times a day (BID) | ORAL | 3 refills | Status: DC

## 2017-12-04 NOTE — Patient Instructions (Signed)

## 2018-12-05 ENCOUNTER — Ambulatory Visit: Payer: BC Managed Care – PPO | Admitting: Cardiovascular Disease

## 2019-01-01 ENCOUNTER — Other Ambulatory Visit: Payer: Self-pay | Admitting: Cardiovascular Disease

## 2019-04-08 ENCOUNTER — Other Ambulatory Visit: Payer: Self-pay | Admitting: Cardiovascular Disease

## 2019-06-09 NOTE — Progress Notes (Signed)
Patient ID: [name], male   DOB: [birth year], 59 y.o.   MRN: [id number]   57 y.o. 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 Compliant with meds. No angina. 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  All other systems reviewed and negative  General: BP 112/68   Pulse (!) 58   Ht 5\' 10"  (1.778 m)   Wt 178 lb (80.7 kg)   SpO2 98%   BMI 25.54 kg/m  Affect appropriate Healthy:  appears stated age 35: normal Neck supple with no adenopathy JVP normal no bruits no thyromegaly Lungs clear with no wheezing and good diaphragmatic motion Heart:  S1/S2 no murmur, no rub, gallop or click PMI normal post sternotomy  Abdomen: benighn, BS positve, no tenderness, no AAA no bruit.  No HSM or HJR Distal pulses intact with no bruits No edema Neuro non-focal Skin warm and dry No muscular weakness Left radial harvest Right SVG harvest LE    Current Outpatient Medications  Medication Sig Dispense Refill  . aspirin 325 MG tablet Take 325 mg by mouth daily.      Marland Kitchen ezetimibe (ZETIA) 10 MG tablet Take 10 mg by mouth daily.    . fish oil-omega-3 fatty acids 1000 MG capsule Take 1 g by mouth 3 (three) times daily.      . metoprolol tartrate (LOPRESSOR) 25 MG tablet Take 1 tablet (25 mg total) by mouth 2 (two) times daily. Pt must keep upcoming appt in Oct to receive further refills 180 tablet 0  . Multiple Vitamins-Minerals  (CENTRUM SILVER PO) Take 1 tablet by mouth daily.      . niacin (NIASPAN) 500 MG CR tablet Take 500 mg by mouth 2 (two) times daily.    . nitroGLYCERIN (NITROSTAT) 0.4 MG SL tablet Place 1 tablet (0.4 mg total) under the tongue every 5 (five) minutes as needed for chest pain (3 doses max). 25 tablet 1  . rosuvastatin (CRESTOR) 40 MG tablet Take 40 mg by mouth daily.       No current facility-administered medications for this visit.     Allergies  Codeine sulfate and Oxycodone-acetaminophen  Electrocardiogram:  06/12/19 SR RAD rate 58 normal otherwise  Assessment and Plan  CAD/CABG: 2010 stable no angina continue medical Rx Two arterial conduits Decrease ASA to 81 mg     Cholesterol:  LDL at goal  continue statin and zetia labs with Dr Nyra Capes   First Degree Block:  Mild f/u yearly ECG  Stable 232 msec 12/04/17    Jenkins Rouge

## 2019-06-12 ENCOUNTER — Other Ambulatory Visit: Payer: Self-pay

## 2019-06-12 ENCOUNTER — Encounter: Payer: Self-pay | Admitting: Cardiovascular Disease

## 2019-06-12 ENCOUNTER — Encounter

## 2019-06-12 ENCOUNTER — Ambulatory Visit: Payer: BC Managed Care – PPO | Admitting: Cardiovascular Disease

## 2019-06-12 VITALS — BP 112/68 | HR 58 | Ht 70.0 in | Wt 178.0 lb

## 2019-06-12 DIAGNOSIS — I25810 Atherosclerosis of coronary artery bypass graft(s) without angina pectoris: Secondary | ICD-10-CM | POA: Diagnosis not present

## 2019-06-12 NOTE — Patient Instructions (Signed)
Medication Instructions:  *If you need a refill on your cardiac medications before your next appointment, please call your pharmacy*  Lab Work: If you have labs (blood work) drawn today and your tests are completely normal, you will receive your results only by: . MyChart Message (if you have MyChart) OR . A paper copy in the mail If you have any lab test that is abnormal or we need to change your treatment, we will call you to review the results.  Testing/Procedures: None ordered today.  Follow-Up: At [facility], you and your health needs are our priority.  As part of our continuing mission to provide you with exceptional heart care, we have created designated Provider Care Teams.  These Care Teams include your primary Cardiologist (physician) and Advanced Practice Providers (APPs -  Physician Assistants and Nurse Practitioners) who all work together to provide you with the care you need, when you need it.  Your next appointment:   12 month(s)  The format for your next appointment:   In Person  Provider:   You may see Dr. Nishan or one of the following Advanced Practice Providers on your designated Care Team:    Lori Gerhardt, NP  Laura Ingold, NP  Jill McDaniel, NP   

## 2019-07-08 ENCOUNTER — Other Ambulatory Visit: Payer: Self-pay | Admitting: Cardiovascular Disease

## 2020-07-01 ENCOUNTER — Encounter: Payer: Self-pay | Admitting: Physician Assistant

## 2020-07-01 ENCOUNTER — Other Ambulatory Visit: Payer: Self-pay

## 2020-07-01 ENCOUNTER — Ambulatory Visit: Payer: BC Managed Care – PPO | Admitting: Physician Assistant

## 2020-07-01 VITALS — BP 122/80 | HR 70 | Ht 70.0 in | Wt 185.6 lb

## 2020-07-01 DIAGNOSIS — E785 Hyperlipidemia, unspecified: Secondary | ICD-10-CM

## 2020-07-01 DIAGNOSIS — I25810 Atherosclerosis of coronary artery bypass graft(s) without angina pectoris: Secondary | ICD-10-CM

## 2020-07-01 MED ORDER — METOPROLOL TARTRATE 25 MG PO TABS: 25.0000 mg | ORAL_TABLET | Freq: Two times a day (BID) | ORAL | 3 refills | Status: DC

## 2020-07-01 NOTE — Progress Notes (Signed)
Cardiology Office Note:    Date:  07/01/2020   ID:  [name], DOB 1961-04-08, MRN [id number]  PCP:  [name], MD  [facility] Cardiologist:  No primary care provider on file.  [facility] Electrophysiologist:  None   Chief Complaint: yearly follow up for CAD  History of Present Illness:    [name] is a 59 y.o. male with a hx of CAD s/p CABG in 2009 (LIMA to LAD; Left radial to OM; SVG mid LAD; SVG IM and SVG RCA), HLD and 1st degree AV block seen for yearly follow up.   Normal ETT 07/2016. Last seen by Dr. Eden Emms 05/2019.  Patient is here for follow-up.  He is very active at baseline doing yard work and exercising.  No exertional limitation.  Denies chest pain, shortness of breath, orthopnea, PND, syncope, dizziness, palpitation or melena.  Reports compliant with his medication.  Never required to take nitroglycerin.  Past Medical History:  Diagnosis Date  . CHEST PAIN   . CORONARY ARTERY BYPASS GRAFT, HX OF   . HYPERLIPIDEMIA   . TACHYCARDIA   . UNS ADVRS EFF UNS RX MEDICINAL&BIOLOGICAL SBSTNC     Past Surgical History:  Procedure Laterality Date  . CORONARY ARTERY BYPASS GRAFT    . HAND SURGERY    . VASECTOMY    . WISDOM TOOTH EXTRACTION      Current Medications: Current Meds  Medication Sig  . aspirin 325 MG tablet Take 325 mg by mouth daily.    Marland Kitchen ezetimibe (ZETIA) 10 MG tablet Take 10 mg by mouth daily.  . fish oil-omega-3 fatty acids 1000 MG capsule Take 1 g by mouth 3 (three) times daily.    . metoprolol tartrate (LOPRESSOR) 25 MG tablet Take 1 tablet (25 mg total) by mouth 2 (two) times daily.  . Multiple Vitamins-Minerals (CENTRUM SILVER PO) Take 1 tablet by mouth daily.    . niacin (NIASPAN) 500 MG CR tablet Take 500 mg by mouth 2 (two) times daily.  . nitroGLYCERIN (NITROSTAT) 0.4 MG SL tablet Place 1 tablet (0.4 mg total) under the tongue every 5 (five) minutes as needed for chest pain (3 doses max).  . rosuvastatin  (CRESTOR) 40 MG tablet Take 40 mg by mouth daily.    . [DISCONTINUED] metoprolol tartrate (LOPRESSOR) 25 MG tablet TAKE 1 TABLET BY MOUTH 2 TIMES DAILY     Allergies:   Codeine sulfate and Oxycodone-acetaminophen   Social History   Socioeconomic History  . Marital status: Married    Spouse name: Not on file  . Number of children: Not on file  . Years of education: Not on file  . Highest education level: Not on file  Occupational History  . Not on file  Tobacco Use  . Smoking status: Never Smoker  . Smokeless tobacco: Never Used  Vaping Use  . Vaping Use: Never used  Substance and Sexual Activity  . Alcohol use: Never  . Drug use: Never  . Sexual activity: Not on file  Other Topics Concern  . Not on file  Social History Narrative  . Not on file   Social Determinants of Health   Financial Resource Strain:   . Difficulty of Paying Living Expenses: Not on file  Food Insecurity:   . Worried About Programme researcher, broadcasting/film/video in the Last Year: Not on file  . Ran Out of Food in the Last Year: Not on file  Transportation Needs:   . Lack of Transportation (Medical):  Not on file  . Lack of Transportation (Non-Medical): Not on file  Physical Activity:   . Days of Exercise per Week: Not on file  . Minutes of Exercise per Session: Not on file  Stress:   . Feeling of Stress : Not on file  Social Connections:   . Frequency of Communication with Friends and Family: Not on file  . Frequency of Social Gatherings with Friends and Family: Not on file  . Attends Religious Services: Not on file  . Active Member of Clubs or Organizations: Not on file  . Attends Banker Meetings: Not on file  . Marital Status: Not on file     Family History: The patient's family history includes Coronary artery disease in his father; Ovarian cancer in his mother.    ROS:   Please see the history of present illness.    All other systems reviewed and are negative.  EKGs/Labs/Other Studies  Reviewed:    The following studies were reviewed today:  ETT 07/2016  Blood pressure demonstrated a normal response to exercise.  There was no ST segment deviation noted during stress.   Normal exercise treadmill stress test with no ischemia. Excellent exercise capacity.   EKG:  EKG is ordered today.  The ekg ordered today demonstrates normal sinus rhythm  Recent Labs: No results found for requested labs within last 8760 hours.  Recent Lipid Panel    Component Value Date/Time   CHOL 176 07/01/2008 0935   TRIG 136 07/01/2008 0935   HDL 32.5 (L) 07/01/2008 0935   CHOLHDL 5.4 CALC 07/01/2008 0935   VLDL 27 07/01/2008 0935   LDLCALC 116 (H) 07/01/2008 0935   LDLDIRECT 149.9 02/03/2008 1006    Physical Exam:    VS:  BP 122/80   Pulse 70   Ht 5\' 10"  (1.778 m)   Wt 185 lb 9.6 oz (84.2 kg)   SpO2 96%   BMI 26.63 kg/m     Wt Readings from Last 3 Encounters:  07/01/20 185 lb 9.6 oz (84.2 kg)  06/12/19 178 lb (80.7 kg)  12/04/17 194 lb (88 kg)     GEN:  Well nourished, well developed in no acute distress HEENT: Normal NECK: No JVD; No carotid bruits LYMPHATICS: No lymphadenopathy CARDIAC: RRR, no murmurs, rubs, gallops RESPIRATORY:  Clear to auscultation without rales, wheezing or rhonchi  ABDOMEN: Soft, non-tender, non-distended MUSCULOSKELETAL:  No edema; No deformity  SKIN: Warm and dry NEUROLOGIC:  Alert and oriented x 3 PSYCHIATRIC:  Normal affect   ASSESSMENT AND PLAN:    1. CAD s/p CABG -No angina.  He takes aspirin 325 mg daily per Dr. 12/06/17 recommendation per patient.  Continue statin and beta-blocker.  2. HLD -Continue statin. -Followed by PCP.    Medication Adjustments/Labs and Tests Ordered: Current medicines are reviewed at length with the patient today.  Concerns regarding medicines are outlined above.  Orders Placed This Encounter  Procedures  . EKG 12-Lead   Meds ordered this encounter  Medications  . metoprolol tartrate  (LOPRESSOR) 25 MG tablet    Sig: Take 1 tablet (25 mg total) by mouth 2 (two) times daily.    Dispense:  180 tablet    Refill:  3    Patient Instructions  Medication Instructions:  Your physician recommends that you continue on your current medications as directed. Please refer to the Current Medication list given to you today.  *If you need a refill on your cardiac medications before your next appointment, please  call your pharmacy*   Lab Work: None ordered  If you have labs (blood work) drawn today and your tests are completely normal, you will receive your results only by: Marland Kitchen MyChart Message (if you have MyChart) OR . A paper copy in the mail If you have any lab test that is abnormal or we need to change your treatment, we will call you to review the results.   Testing/Procedures: None ordered   Follow-Up: At [facility], you and your health needs are our priority.  As part of our continuing mission to provide you with exceptional heart care, we have created designated Provider Care Teams.  These Care Teams include your primary Cardiologist (physician) and Advanced Practice Providers (APPs -  Physician Assistants and Nurse Practitioners) who all work together to provide you with the care you need, when you need it.  We recommend signing up for the patient portal called "MyChart".  Sign up information is provided on this After Visit Summary.  MyChart is used to connect with patients for Virtual Visits (Telemedicine).  Patients are able to view lab/test results, encounter notes, upcoming appointments, etc.  Non-urgent messages can be sent to your provider as well.   To learn more about what you can do with MyChart, go to [email or web address].    Your next appointment:   12 month(s)  The format for your next appointment:   In Person  Provider:   You may see [name], MD or one of the following Advanced Practice Providers on your designated Care Team:    Norma Fredrickson, NP  Nada Boozer, NP  Georgie Chard, NP    Other Instructions      Signed, Manson Passey, [place]  07/01/2020 1:56 PM    [facility]

## 2020-07-01 NOTE — Patient Instructions (Signed)
Medication Instructions:  Your physician recommends that you continue on your current medications as directed. Please refer to the Current Medication list given to you today.  *If you need a refill on your cardiac medications before your next appointment, please call your pharmacy*   Lab Work: None ordered  If you have labs (blood work) drawn today and your tests are completely normal, you will receive your results only by: . MyChart Message (if you have MyChart) OR . A paper copy in the mail If you have any lab test that is abnormal or we need to change your treatment, we will call you to review the results.   Testing/Procedures: None ordered   Follow-Up: At [facility], you and your health needs are our priority.  As part of our continuing mission to provide you with exceptional heart care, we have created designated Provider Care Teams.  These Care Teams include your primary Cardiologist (physician) and Advanced Practice Providers (APPs -  Physician Assistants and Nurse Practitioners) who all work together to provide you with the care you need, when you need it.  We recommend signing up for the patient portal called "MyChart".  Sign up information is provided on this After Visit Summary.  MyChart is used to connect with patients for Virtual Visits (Telemedicine).  Patients are able to view lab/test results, encounter notes, upcoming appointments, etc.  Non-urgent messages can be sent to your provider as well.   To learn more about what you can do with MyChart, go to [email or web address].    Your next appointment:   12 month(s)  The format for your next appointment:   In Person  Provider:   You may see Peter Nishan, MD or one of the following Advanced Practice Providers on your designated Care Team:    Lori Gerhardt, NP  Laura Ingold, NP  Jill McDaniel, NP    Other Instructions   

## 2021-05-27 ENCOUNTER — Encounter: Payer: Self-pay | Admitting: Gastroenterology

## 2021-06-28 ENCOUNTER — Encounter: Payer: Self-pay | Admitting: Physician Assistant

## 2021-06-28 NOTE — Progress Notes (Signed)
Cardiology Office Note    Date:  07/01/2021   ID:  [name], DOB 23-Jul-1961, MRN [id number]  PCP:  [name], MD  Cardiologist:  [name], MD  Electrophysiologist:  None   Chief Complaint: 1 year follow-up of CAD with hx of CABG  History of Present Illness:   [name] is a 60 y.o. male with history of CAD s/p CABG 2009 (LIMA to LAD; Left radial to OM; SVG mid LAD; SVG IM and SVG RCA), HLD and 1st degree AV block who presents for routine follow-up. His last cath was prior to CABG - at that time, EF was 60% per cath report. He had an ETT 2017 that was normal. No prior echo on file. His last ov was 11/21 at which time he was doing well and 1 yr f/u was recommended.   Today he is here alone. He reports that he is feeling well and has no concerns. Reports that he is active doing strenuous yard work without symptoms of chest discomfort or SOB. He denies palpitations, orthopnea, PND, edema, or dizziness. Works as a Runner, broadcasting/film/video and reports he worked during the summer which is not typical for him and caused additional stress. Due to the stress of work and the death of his mother spring 2022, he has not been eating healthy and not walking for exercise. He states he is working on improving his diet and exercising more. We discussed the recent lab work from his PCP including his LDL of 89 which is > goal of 70. He reports compliance with ezetimibe and rosuvastatin. He also takes niacin which he has taken for many years. He does not seem inclined to stop it despite discussion regarding its lack of clinical significance. Discussed switching him to PCSK9-I and offered opportunity to meet with lipid clinic pharmacists. He is hesitant to change therapy at this time and would like additional time to consider. Discussed that we do not have a baseline echocardiogram on file for him. His mother had valve surgery prior to her death. He prefers to postpone this due to no symptoms or concerns  at this time.   Labwork independently reviewed: [facility] 06/14/21 - A1C 5.7, LFTs wnl, Na wnl, K 3.9, Cr 0.950, Tchol 155, HLD 52, LDL 89, trig 74   Past Medical History:  Diagnosis Date   CAD in native artery    CORONARY ARTERY BYPASS GRAFT, HX OF    First degree AV block    HYPERLIPIDEMIA    TACHYCARDIA    UNS ADVRS EFF UNS RX MEDICINAL&BIOLOGICAL SBSTNC     Past Surgical History:  Procedure Laterality Date   CORONARY ARTERY BYPASS GRAFT     HAND SURGERY     VASECTOMY     WISDOM TOOTH EXTRACTION      Current Medications: Current Meds  Medication Sig   aspirin EC 81 MG tablet Take 81 mg by mouth daily. Swallow whole.   ezetimibe (ZETIA) 10 MG tablet Take 10 mg by mouth daily.   fish oil-omega-3 fatty acids 1000 MG capsule Take 1 g by mouth 3 (three) times daily.     metoprolol tartrate (LOPRESSOR) 25 MG tablet Take 1 tablet (25 mg total) by mouth 2 (two) times daily.   Multiple Vitamins-Minerals (CENTRUM SILVER PO) Take 1 tablet by mouth daily.     niacin (NIASPAN) 500 MG CR tablet Take 500 mg by mouth 2 (two) times daily.   nitroGLYCERIN (NITROSTAT) 0.4 MG SL tablet Place 1 tablet (0.4  mg total) under the tongue every 5 (five) minutes as needed for chest pain (3 doses max).   rosuvastatin (CRESTOR) 40 MG tablet Take 40 mg by mouth daily.      Allergies:   Codeine sulfate and Oxycodone-acetaminophen   Social History   Socioeconomic History   Marital status: Married    Spouse name: Not on file   Number of children: Not on file   Years of education: Not on file   Highest education level: Not on file  Occupational History   Not on file  Tobacco Use   Smoking status: Never   Smokeless tobacco: Never  Vaping Use   Vaping Use: Never used  Substance and Sexual Activity   Alcohol use: Never   Drug use: Never   Sexual activity: Not on file  Other Topics Concern   Not on file  Social History Narrative   Not on file   Social Determinants of Health   Financial  Resource Strain: Not on file  Food Insecurity: Not on file  Transportation Needs: Not on file  Physical Activity: Not on file  Stress: Not on file  Social Connections: Not on file     Family History:  The patient's family history includes Coronary artery disease in his father; Ovarian cancer, heart valve surgery in his mother.  ROS:   Please see the history of present illness. All other systems reviewed and no positive findings on review of systems.   EKGs/Labs/Other Studies Reviewed:    Studies reviewed are outlined and summarized above. Reports included below if pertinent.  ETT 2017  Blood pressure demonstrated a normal response to exercise. There was no ST segment deviation noted during stress.   Normal exercise treadmill stress test with no ischemia. Excellent exercise capacity.   Cath 02/2008  PROCEDURES:  1. Left heart catheterization.  2. Selective coronary angiography.  3. Left ventricular angiogram with left ventricular pressures.    OPERATORS:  1. Bruce R. Juanda Chance, MD, [facility]  2. Verne Carrow, MD.    INDICATION:  A 60 year old Caucasian male with a history of  hyperlipidemia and a family history of coronary artery disease who has  been having exertional chest discomfort for the last 6-8 months.  He was  seen by Dr. [name] in the office yesterday, and based on his  family history as well as his story, Dr. Eden Emms felt that a left heart  catheterization was indicated today.    DETAILS OF PROCEDURE:  The patient was brought to the heart  catheterization laboratory after signing informed consent.  His right  groin was prepped and draped in a sterile fashion.  The right femoral  artery was engaged with 4-French arterial sheath using the Seldinger  technique.  Diagnostic 4-French JL4 and 3DRC catheters were used for the  coronary angiogram.  Following the coronary angiogram, a left  ventricular angiogram was performed with a pigtail catheter.  The   patient tolerated the procedure well.  The sheath was removed here in  the catheterization laboratory.  The patient was taken to the recovery  area in stable condition.  He did receive 2 mg of Versed and 25 mcg of  fentanyl here in the catheterization laboratory.    FINDINGS:  1. Left main coronary artery was normal.  2. The proximal portion of the left anterior descending coronary      artery showed a 40% stenosis.  There are 3 diagonal branches that      arise from the LAD  and they are all normal.  Just distal to the      first diagonal artery was a 95-99% stenosis.  Just distal to the      third diagonal coronary artery was another 95-99% stenosis.  3. The circumflex artery also rose from the left main coronary artery      and showed an 80% stenosis in the mid circumflex.  There were      several obtuse marginal coronary arteries that arise from the      circumflex.  There was an ostial 80% stenosis of the first obtuse      marginal. There is a large Ramus intermedius branch that arises      from the proximal portion of the Circumflex that has an 80%      stenosis.  4. The right coronary artery was a dominant artery and had 100% lesion      in the proximal portion.  The mid and distal right coronary artery      filled from collaterals via the circumflex system.  5. Left ventricular angiogram showed normal wall motion and ejection      fraction, EF=60%..  The left ventricular pressure was 131/3 with an      end-diastolic pressure of 5.    IMPRESSION:  1. Triple-vessel coronary artery disease.  2. Normal left ventricular function.    RECOMMENDATIONS:  Based on this patient's triple-vessel coronary artery  disease and his age, we have elected to send him for a surgical  consultation for a possible four or five-vessel coronary artery bypass  grafting surgery.  He will be discharged to home today following his  recovery from the catheterization.  The patient understands that should   he have any worsening of his symptoms, he should call us immediately.       Verne Carrow, MD  Electronically Signed      EKG:  EKG is ordered today, personally reviewed, demonstrating SR at rate of 74 bpm, PR 192 ms, no ST/T wave abnormalities, poor R wave progression stable and consistent with past tracings   PHYSICAL EXAM:    VS:  BP 120/80   Pulse 74   Ht 5\' 10"  (1.778 m)   Wt 190 lb 3.2 oz (86.3 kg)   SpO2 97%   BMI 27.29 kg/m   BMI: Body mass index is 27.29 kg/m.  GEN: Well nourished, well developed male in no acute distress HEENT: normocephalic, atraumatic Neck: no JVD, carotid bruits, or masses Cardiac: RRR; no murmurs, rubs, or gallops, no edema  Respiratory:  clear to auscultation bilaterally, normal work of breathing GI: soft, nontender, nondistended, + BS MS: no deformity or atrophy Skin: warm and dry, no rash Neuro:  Alert and Oriented x 3, Strength and sensation are intact, follows commands Psych: euthymic mood, full affect  Wt Readings from Last 3 Encounters:  07/01/21 190 lb 3.2 oz (86.3 kg)  07/01/20 185 lb 9.6 oz (84.2 kg)  06/12/19 178 lb (80.7 kg)     ASSESSMENT & PLAN:   CAD native without angina/Hx of CABG x 3 2009: He denies angina or dyspnea  or any component suggestive of angina equivalent. No concerns with activity intolerance. Encouraged increase in exercise and heart healthy diet. Would recommend baseline echocardiogram in the future, patient does not want to schedule at this time. Will continue beta blocker, statin, aspirin.   2. Hyperlipidemia LDL goal < 70: Labs 10/22 from PCP LDL 89, not at goal. Reports compliance on ezetimibe and  rosuvastatin. He also takes niacin which we have advised he discontinue due to lack of evidence to suggest significant clinical benefit. We did discuss role of adjuvant therapy I.e. PCSK9 inhibitors. He requests information on PCSK9-I and will call back if he decides he would like to pursue. Offered 3 month  recheck of lipid/liver which he states he will do at PCP.   3.  First degree AV block: previously mentioned in cardiology note. Last seen on 2020 tracing. ECG today shows SR with PR interval of 194 ms, not indicative of 1st degree HB. No symptoms to suggest higher grade block or bradycardia. Patient aware of the finding and no further work-up indicated.   Disposition: F/u with Dr. Eden Emms in 1 year   Medication Adjustments/Labs and Tests Ordered: Current medicines are reviewed at length with the patient today.  Concerns regarding medicines are outlined above. Medication changes, Labs and Tests ordered today are summarized above and listed in the Patient Instructions accessible in Encounters.   Signed, Levi Aland, NP  07/01/2021 2:32 PM    [facility] [address] [place], [place], [place]  [address] Phone: [phone]; Fax: [phone]

## 2021-07-01 ENCOUNTER — Ambulatory Visit: Payer: BC Managed Care – PPO | Admitting: Nurse Practitioner

## 2021-07-01 ENCOUNTER — Other Ambulatory Visit: Payer: Self-pay

## 2021-07-01 ENCOUNTER — Encounter: Payer: Self-pay | Admitting: Physician Assistant

## 2021-07-01 VITALS — BP 120/80 | HR 74 | Ht 70.0 in | Wt 190.2 lb

## 2021-07-01 DIAGNOSIS — I440 Atrioventricular block, first degree: Secondary | ICD-10-CM | POA: Diagnosis not present

## 2021-07-01 DIAGNOSIS — I2510 Atherosclerotic heart disease of native coronary artery without angina pectoris: Secondary | ICD-10-CM

## 2021-07-01 DIAGNOSIS — E785 Hyperlipidemia, unspecified: Secondary | ICD-10-CM | POA: Diagnosis not present

## 2021-07-01 DIAGNOSIS — Z951 Presence of aortocoronary bypass graft: Secondary | ICD-10-CM | POA: Diagnosis not present

## 2021-07-01 LAB — CBC
Hematocrit: 48 % (ref 37.5–51.0)
Hemoglobin: 16.7 g/dL (ref 13.0–17.7)
MCH: 32.1 pg (ref 26.6–33.0)
MCHC: 34.8 g/dL (ref 31.5–35.7)
MCV: 92 fL (ref 79–97)
Platelets: 220 10*3/uL (ref 150–450)
RBC: 5.21 x10E6/uL (ref 4.14–5.80)
RDW: 13.2 % (ref 11.6–15.4)
WBC: 10.9 10*3/uL — ABNORMAL HIGH (ref 3.4–10.8)

## 2021-07-01 MED ORDER — METOPROLOL TARTRATE 25 MG PO TABS: 25.0000 mg | ORAL_TABLET | Freq: Two times a day (BID) | ORAL | 3 refills | Status: DC

## 2021-07-01 NOTE — Patient Instructions (Addendum)
Medication Instructions:  Your physician has recommended you make the following change in your medication:   STOP Niacin   *If you need a refill on your cardiac medications before your next appointment, please call your pharmacy*   Lab Work: TODAY:  CBC   COME FOR FASTING LABS, NOTHING TO EAT OR DRINK AFTER MIDNIGHT THE NIGHT BEFORE FOR:  LIPID & CMET  If you have labs (blood work) drawn today and your tests are completely normal, you will receive your results only by: MyChart Message (if you have MyChart) OR A paper copy in the mail If you have any lab test that is abnormal or we need to change your treatment, we will call you to review the results.   Testing/Procedures: None ordered   Follow-Up: At [facility], you and your health needs are our priority.  As part of our continuing mission to provide you with exceptional heart care, we have created designated Provider Care Teams.  These Care Teams include your primary Cardiologist (physician) and Advanced Practice Providers (APPs -  Physician Assistants and Nurse Practitioners) who all work together to provide you with the care you need, when you need it.  We recommend signing up for the patient portal called "MyChart".  Sign up information is provided on this After Visit Summary.  MyChart is used to connect with patients for Virtual Visits (Telemedicine).  Patients are able to view lab/test results, encounter notes, upcoming appointments, etc.  Non-urgent messages can be sent to your provider as well.   To learn more about what you can do with MyChart, go to [email or web address].    Your next appointment:   1 year(s)  The format for your next appointment:   In Person  Provider:   [name], MD     Other Instructions

## 2021-09-30 ENCOUNTER — Other Ambulatory Visit: Payer: BC Managed Care – PPO

## 2022-06-30 ENCOUNTER — Ambulatory Visit: Payer: BC Managed Care – PPO | Admitting: Nurse Practitioner

## 2022-07-03 ENCOUNTER — Other Ambulatory Visit: Payer: Self-pay

## 2022-07-03 MED ORDER — METOPROLOL TARTRATE 25 MG PO TABS: 25.0000 mg | ORAL_TABLET | Freq: Two times a day (BID) | ORAL | 0 refills | Status: DC

## 2022-09-13 ENCOUNTER — Ambulatory Visit: Payer: BC Managed Care – PPO | Admitting: Cardiovascular Disease

## 2022-09-20 ENCOUNTER — Ambulatory Visit: Payer: BC Managed Care – PPO | Attending: Nurse Practitioner | Admitting: Cardiovascular Disease

## 2022-09-20 ENCOUNTER — Encounter: Payer: Self-pay | Admitting: Cardiovascular Disease

## 2022-09-20 VITALS — BP 122/84 | HR 69 | Ht 70.0 in | Wt 196.0 lb

## 2022-09-20 DIAGNOSIS — E785 Hyperlipidemia, unspecified: Secondary | ICD-10-CM

## 2022-09-20 DIAGNOSIS — Z951 Presence of aortocoronary bypass graft: Secondary | ICD-10-CM | POA: Diagnosis not present

## 2022-09-20 DIAGNOSIS — I2510 Atherosclerotic heart disease of native coronary artery without angina pectoris: Secondary | ICD-10-CM | POA: Diagnosis not present

## 2022-09-20 NOTE — Patient Instructions (Addendum)
Medication Instructions:  Your physician recommends that you continue on your current medications as directed. Please refer to the Current Medication list given to you today.  *If you need a refill on your cardiac medications before your next appointment, please call your pharmacy*  Lab Work: If you have labs (blood work) drawn today and your tests are completely normal, you will receive your results only by: [place] (if you have MyChart) OR A paper copy in the mail If you have any lab test that is abnormal or we need to change your treatment, we will call you to review the results.  Testing/Procedures: None ordered today.  Follow-Up: At [facility], you and your health needs are our priority.  As part of our continuing mission to provide you with exceptional heart care, we have created designated Provider Care Teams.  These Care Teams include your primary Cardiologist (physician) and Advanced Practice Providers (APPs -  Physician Assistants and Nurse Practitioners) who all work together to provide you with the care you need, when you need it.  We recommend signing up for the patient portal called "MyChart".  Sign up information is provided on this After Visit Summary.  MyChart is used to connect with patients for Virtual Visits (Telemedicine).  Patients are able to view lab/test results, encounter notes, upcoming appointments, etc.  Non-urgent messages can be sent to your provider as well.   To learn more about what you can do with MyChart, go to [email or web address].    Your next appointment:   1 year(s)  Provider:   Jenkins Rouge, MD     Other Instructions You have been referred to [facility] next available appointment.

## 2022-09-20 NOTE — Progress Notes (Signed)
Patient ID: [name], male   DOB: 1960-10-06, 62 y.o.   MRN: [id number]   62 y.o. 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 Compliant with meds. No angina. 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  All other systems reviewed and negative  General: BP 122/84   Pulse 69   Ht 5\' 10"  (1.778 m)   Wt 196 lb (88.9 kg)   BMI 28.12 kg/m  Affect appropriate Healthy:  appears stated age 46: normal Neck supple with no adenopathy JVP normal no bruits no thyromegaly Lungs clear with no wheezing and good diaphragmatic motion Heart:  S1/S2 no murmur, no rub, gallop or click PMI normal post sternotomy  Abdomen: benighn, BS positve, no tenderness, no AAA no bruit.  No HSM or HJR Distal pulses intact with no bruits No edema Neuro non-focal Skin warm and dry No muscular weakness Left radial harvest Right SVG harvest LE    Current Outpatient Medications  Medication Sig Dispense Refill   aspirin EC 81 MG tablet Take 81 mg by mouth daily. Swallow whole.     ezetimibe (ZETIA) 10 MG tablet Take 10 mg by mouth daily.     fish oil-omega-3 fatty acids 1000 MG capsule Take 1 g by mouth 3 (three) times daily.       metoprolol tartrate (LOPRESSOR) 25 MG tablet Take 1 tablet (25 mg total) by mouth 2 (two) times daily. 180 tablet 0   Multiple Vitamins-Minerals (CENTRUM  SILVER PO) Take 1 tablet by mouth daily.       nitroGLYCERIN (NITROSTAT) 0.4 MG SL tablet Place 1 tablet (0.4 mg total) under the tongue every 5 (five) minutes as needed for chest pain (3 doses max). 25 tablet 1   rosuvastatin (CRESTOR) 40 MG tablet Take 40 mg by mouth daily.       No current facility-administered medications for this visit.    Allergies  Codeine sulfate and Oxycodone-acetaminophen  Electrocardiogram:  09/20/2022  SR RAD rate 69 normal otherwise  Assessment and Plan  CAD/CABG: 2010 stable no angina continue medical Rx Two arterial conduits Decrease ASA to 81 mg     Cholesterol:  LDL not at goal he has been hesitant to start PSK9 refer to lipid clniic   First Degree Block:  Resolved PR 198 msec today   F/U in a year  Lipid Clinic for [place]

## 2022-09-26 ENCOUNTER — Other Ambulatory Visit: Payer: Self-pay

## 2022-09-26 MED ORDER — METOPROLOL TARTRATE 25 MG PO TABS: 25.0000 mg | ORAL_TABLET | Freq: Two times a day (BID) | ORAL | 3 refills | Status: DC

## 2022-10-17 ENCOUNTER — Encounter: Payer: Self-pay | Admitting: Cardiovascular Disease

## 2022-11-20 ENCOUNTER — Telehealth: Payer: Self-pay | Admitting: Pharmacist

## 2022-11-20 ENCOUNTER — Encounter: Payer: Self-pay | Admitting: Pharmacist

## 2022-11-20 ENCOUNTER — Ambulatory Visit: Payer: BC Managed Care – PPO | Attending: Internal Medicine | Admitting: Pharmacist

## 2022-11-20 DIAGNOSIS — E782 Mixed hyperlipidemia: Secondary | ICD-10-CM

## 2022-11-20 MED ORDER — REPATHA SURECLICK 140 MG/ML ~~LOC~~ SOAJ: 1.0000 | SUBCUTANEOUS | 3 refills | Status: DC

## 2022-11-20 NOTE — Telephone Encounter (Signed)
Repatha PA submitted, Key: [id number]. Will reach out to pt via mychart once approved.

## 2022-11-20 NOTE — Telephone Encounter (Signed)
Repatha PA approved through 11/20/23. $5 copay card activated and sent to pt via mychart.

## 2022-11-20 NOTE — Progress Notes (Unsigned)
Patient ID: [name]                 DOB: 06-24-1961                    MRN: [id number]     HPI: [name] is a 62 y.o. male patient referred to lipid clinic by Dr Johnsie Cancel. PMH is significant for CAD s/p CABG in 2009 and HLD. LDL remained elevated on high intensity statin and ezetimibe and pt was referred to lipid clinic to discuss PCSK9i.  Pt presents today for follow up. Reports tolerating his rosuvastatin and ezetimibe well. Reports adherence. Recalls baseline LDL cholesterol in the 250 range before he was started on lipid lowering therapy. His father had heart surgery in his early 36s. He researched PCSK9i a bit after Sharyn Lull mentioned them at his last appt.  Current Medications: rosuvastatin 40mg  daily, ezetimibe 10mg  daily, OTC fish oil 3g daily Intolerances: none Risk Factors: premature ASCVD LDL goal: 55mg /dL  Diet:  Breakfast - jimmy dean sandwich on english muffin, Pepsi Lunch - PB&J and chips Dinner - eats at home, avoids fried food, 1 glass sweet tea Drinks - 1 Pepsi at breakfast, water the rest of the day then sweet tea  Exercise: works in the yard, gym 3 days a week strength training  Family History: Coronary artery disease in his father; Ovarian cancer, heart valve surgery in his mother.   Social History: No tobacco, alcohol or drug use.  Labs: 09/04/22: TC 160, TG 90, HDL 46, LDL 97 - rosuvastatin 40mg  daily, ezetimibe 10mg  daily  Past Medical History:  Diagnosis Date   CAD in native artery    CORONARY ARTERY BYPASS GRAFT, HX OF    First degree AV block    HYPERLIPIDEMIA    TACHYCARDIA    UNS ADVRS EFF UNS RX MEDICINAL&BIOLOGICAL SBSTNC     Current Outpatient Medications on File Prior to Visit  Medication Sig Dispense Refill   aspirin EC 81 MG tablet Take 81 mg by mouth daily. Swallow whole.     ezetimibe (ZETIA) 10 MG tablet Take 10 mg by mouth daily.     fish oil-omega-3 fatty acids 1000 MG capsule Take 1 g by mouth 3 (three) times  daily.       metoprolol tartrate (LOPRESSOR) 25 MG tablet Take 1 tablet (25 mg total) by mouth 2 (two) times daily. 180 tablet 3   Multiple Vitamins-Minerals (CENTRUM SILVER PO) Take 1 tablet by mouth daily.       nitroGLYCERIN (NITROSTAT) 0.4 MG SL tablet Place 1 tablet (0.4 mg total) under the tongue every 5 (five) minutes as needed for chest pain (3 doses max). 25 tablet 1   rosuvastatin (CRESTOR) 40 MG tablet Take 40 mg by mouth daily.       No current facility-administered medications on file prior to visit.    Allergies  Allergen Reactions   Codeine Sulfate     REACTION: migraine headache   Oxycodone-Acetaminophen     REACTION: headache    Assessment/Plan:  1. Hyperlipidemia - LDL 97 on rosuvastatin 40mg  daily and ezetimibe 10mg  daily, above goal < 55 given hx of premature ASCVD. Discussed PCSK9i including dosing, side effects, potential decreases in LDL cholesterol, and CV outcomes. Pt is interested in trying Repatha, will submit prior auth and follow up with pt once approved. He will qualify for $5 copay card. Sees his PCP on July 18 for repeat lab work. Can potentially stop ezetimibe at  that time pending response to Repatha. Also advised pt can stop OTC fish oil, and to cut back on sweet drinks (Pepsi and sweet tea) in his diet.  Danett Palazzo E. Renate Danh, PharmD, BCACP, [place]. [address], [place], [place] [address] Phone: [phone]; Fax: [phone] 11/20/2022 8:52 AM

## 2022-11-20 NOTE — Patient Instructions (Addendum)
Your LDL cholesterol is 97 and your goal is < 55  I will submit information to your insurance for Repatha and let you know when I hear back.    Repatha is a subcutaneous injection given once every 2 weeks in the fatty tissue of your stomach or upper outer thigh. Store the medication in the fridge. You can let your dose warm up to room temperature for 30 minutes before injecting if you prefer. Repatha will lower your LDL cholesterol by 60% and helps to lower your chance of having a heart attack or stroke.  Continue taking your rosuvastatin and ezetimibe each day  Have your primary care doctor recheck your cholesterol in June. Depending on your lab results, we may be able to stop your ezetimibe  Cut back on sweet drinks like Pepsi and sweet tea

## 2023-04-05 ENCOUNTER — Encounter: Payer: Self-pay | Admitting: Cardiovascular Disease

## 2023-04-05 MED ORDER — NITROGLYCERIN 0.4 MG SL SUBL: 0.4000 mg | SUBLINGUAL_TABLET | SUBLINGUAL | 3 refills | Status: AC | PRN

## 2023-04-06 ENCOUNTER — Encounter: Payer: Self-pay | Admitting: Cardiovascular Disease

## 2023-04-06 NOTE — Telephone Encounter (Signed)
Error

## 2023-04-06 NOTE — Telephone Encounter (Signed)
Spoke with patient about his MyChart message reporting intermittent chest pain for the past 2-3 days.  Patient states he has not had any CP today. He picked up his Rx of NTG SL this morning. He has not had to take any NTG so far. Reviewed instructions for NTG use and ED precautions. Patient verbalized understanding.   Patient will see [name] on 04/13/23.

## 2023-04-06 NOTE — Telephone Encounter (Signed)
   Pt c/o of Chest Pain: STAT if active CP, including tightness, pressure, jaw pain, radiating pain to shoulder/upper arm/back, CP unrelieved by Nitro. Symptoms reported of SOB, nausea, vomiting, sweating.  1. Are you having CP right now? No     2. Are you experiencing any other symptoms (ex. SOB, nausea, vomiting, sweating)? No    3. Is your CP continuous or coming and going? Coming and going    4. Have you taken Nitroglycerin? No    5. How long have you been experiencing CP? 2-3 days     6. If NO CP at time of call then end call with telling Pt to call back or call 911 if Chest pain returns prior to return call from triage team.    Pt called back about cp and asked to make an appt. Scheduled  for 8/26 with Julian Hy, PA just wanted to make clinical staff aware.

## 2023-04-11 DIAGNOSIS — I440 Atrioventricular block, first degree: Secondary | ICD-10-CM | POA: Insufficient documentation

## 2023-04-11 DIAGNOSIS — I2510 Atherosclerotic heart disease of native coronary artery without angina pectoris: Secondary | ICD-10-CM | POA: Insufficient documentation

## 2023-04-12 NOTE — Progress Notes (Signed)
Cardiology Office Note:  .   Date:  04/13/2023  ID:  [name], DOB Jan 16, 1961, MRN [id number] PCP: [name], MD  [facility] Providers Cardiologist:  [name], MD    History of Present Illness: .   [name] is a 62 y.o. male with a past medical history of CAD s/p CABG x 5  2009, first-degree AV block, hyperlipidemia.  CABG times 12/2007 Exercise tolerance test December 2017 normal  Most recently evaluated by Dr. Eden Emms on 09/20/22, he was doing well from a cardiac perspective, he was advised to follow-up with the lipid clinic for PCSK9 inhibitor consideration and advised to follow-up in 1 year.  He presents today for follow-up of intermittent chest pain that has been going on for the last 2 weeks.  Pain has been hard for him to describe, feels like something is " touching his chest", initially was not concerned about this however then he began experiencing left lower arm pain as well as pain in his mid back.  Pain can last for up to 15 minutes, has been alleviated with rest, typically occurs while at rest.  On 1 occasion he took nitroglycerin x 2 with resolution of pain.  He states his wife has noticed he has been more fatigued than normal lately as well.  He has not had any recurrence of pain over the last 2 days. He denies palpitations, dyspnea, pnd, orthopnea, n, v, dizziness, syncope, edema, weight gain, or early satiety.   ROS: Review of Systems  Constitutional:  Positive for malaise/fatigue.  Cardiovascular:  Positive for chest pain.  All other systems reviewed and are negative.    Studies Reviewed: .       Cardiac Studies & Procedures     STRESS TESTS  EXERCISE TOLERANCE TEST (ETT) 08/16/2016  Narrative  Blood pressure demonstrated a normal response to exercise.  There was no ST segment deviation noted during stress.  Normal exercise treadmill stress test with no ischemia. Excellent exercise capacity.              Risk  Assessment/Calculations:             Physical Exam:   VS:  BP 118/70   Pulse 62   Ht 5\' 10"  (1.778 m)   Wt 187 lb 3.2 oz (84.9 kg)   SpO2 97%   BMI 26.86 kg/m    Wt Readings from Last 3 Encounters:  04/13/23 187 lb 3.2 oz (84.9 kg)  09/20/22 196 lb (88.9 kg)  07/01/21 190 lb 3.2 oz (86.3 kg)    GEN: Well nourished, well developed in no acute distress NECK: No JVD; No carotid bruits CARDIAC: RRR, no murmurs, rubs, gallops RESPIRATORY:  Clear to auscultation without rales, wheezing or rhonchi  ABDOMEN: Soft, non-tender, non-distended EXTREMITIES:  No edema; No deformity   ASSESSMENT AND PLAN: .   Coronary artery disease-s/p CABG x 5 in 2009, repeat ETT in 2017 was normal.  He is having some chest pain, will proceed with Lexiscan for further evaluation.  EKG without changes.  Continue aspirin 81 mg daily, continue Repatha, continue metoprolol 25 mg twice daily, continue nitroglycerin as needed, we will start him on Imdur 30 mg daily.  We discussed when to notify EMS/present to the hospital for further evaluation.  Hyperlipidemia-most recent LDL was well-controlled at 24, will stop Zetia, continue Repatha, continue Crestor 40 mg daily.  Informed Consent   Shared Decision Making/Informed Consent The risks [chest pain, shortness of breath, cardiac arrhythmias,  dizziness, blood pressure fluctuations, myocardial infarction, stroke/transient ischemic attack, nausea, vomiting, allergic reaction, radiation exposure, metallic taste sensation and life-threatening complications (estimated to be 1 in 10,000)], benefits (risk stratification, diagnosing coronary artery disease, treatment guidance) and alternatives of a nuclear stress test were discussed in detail with Mr. Loggins and he agrees to proceed.     Dispo: Lexiscan, return to Dr. Eden Emms in 6 weeks.   Signed, Flossie Dibble, NP

## 2023-04-13 ENCOUNTER — Ambulatory Visit: Payer: BC Managed Care – PPO | Attending: Physician Assistant | Admitting: Cardiology

## 2023-04-13 ENCOUNTER — Encounter: Payer: Self-pay | Admitting: Cardiology

## 2023-04-13 VITALS — BP 118/70 | HR 62 | Ht 70.0 in | Wt 187.2 lb

## 2023-04-13 DIAGNOSIS — E782 Mixed hyperlipidemia: Secondary | ICD-10-CM

## 2023-04-13 DIAGNOSIS — R079 Chest pain, unspecified: Secondary | ICD-10-CM

## 2023-04-13 MED ORDER — ISOSORBIDE MONONITRATE ER 30 MG PO TB24: 30.0000 mg | ORAL_TABLET | Freq: Every day | ORAL | 3 refills | Status: DC

## 2023-04-13 NOTE — Patient Instructions (Addendum)
Medication Instructions:  Your physician has recommended you make the following change in your medication:   Start Imdur 30 mg daily  Stop Zetia  *If you need a refill on your cardiac medications before your next appointment, please call your pharmacy*   Lab Work: None ordered If you have labs (blood work) drawn today and your tests are completely normal, you will receive your results only by: MyChart Message (if you have MyChart) OR A paper copy in the mail If you have any lab test that is abnormal or we need to change your treatment, we will call you to review the results.   Testing/Procedures: You are scheduled for a Myocardial Perfusion Imaging Study.  Please arrive 15 minutes prior to your appointment time for registration and insurance purposes.  The test will take approximately 3 to 4 hours to complete; you may bring reading material.  If someone comes with you to your appointment, they will need to remain in the main lobby due to limited space in the testing area.   How to prepare for your Myocardial Perfusion Test: Do not eat or drink 3 hours prior to your test, except you may have water. Do not consume products containing caffeine (regular or decaffeinated) 12 hours prior to your test. (ex: coffee, chocolate, sodas, tea). Do bring a list of your current medications with you.  If not listed below, you may take your medications as normal. Do wear comfortable clothes (no dresses or overalls) and walking shoes, tennis shoes preferred (No heels or open toe shoes are allowed). Do NOT wear cologne, perfume, aftershave, or lotions (deodorant is allowed). If these instructions are not followed, your test will have to be rescheduled.  If you cannot keep your appointment, please provide 24 hours notification to the Nuclear Lab, to avoid a possible $50 charge to your account.  Follow-Up: At [facility], you and your health needs are our priority.  As part of our continuing  mission to provide you with exceptional heart care, we have created designated Provider Care Teams.  These Care Teams include your primary Cardiologist (physician) and Advanced Practice Providers (APPs -  Physician Assistants and Nurse Practitioners) who all work together to provide you with the care you need, when you need it.  We recommend signing up for the patient portal called "MyChart".  Sign up information is provided on this After Visit Summary.  MyChart is used to connect with patients for Virtual Visits (Telemedicine).  Patients are able to view lab/test results, encounter notes, upcoming appointments, etc.  Non-urgent messages can be sent to your provider as well.   To learn more about what you can do with MyChart, go to [email or web address].    Your next appointment:   6 week(s)  Provider:   [name], MD    Other Instructions  Cardiac Nuclear Scan A cardiac nuclear scan is a test that is done to check the flow of blood to your heart. It is done when you are resting and when you are exercising. The test looks for problems such as: Not enough blood reaching a portion of the heart. The heart muscle not working as it should. You may need this test if you have: Heart disease. Lab results that are not normal. Had heart surgery or a balloon procedure to open up blocked arteries (angioplasty) or a small mesh tube (stent). Chest pain. Shortness of breath. Had a heart attack. In this test, a special dye (tracer) is put into your bloodstream. The  tracer will travel to your heart. A camera will then take pictures of your heart to see how the tracer moves through your heart. This test is usually done at a hospital and takes 2-4 hours. Tell a doctor about: Any allergies you have. All medicines you are taking, including vitamins, herbs, eye drops, creams, and over-the-counter medicines. Any bleeding problems you have. Any surgeries you have had. Any medical conditions you  have. Whether you are pregnant or may be pregnant. Any history of asthma or long-term (chronic) lung disease. Any history of heart rhythm disorders or heart valve conditions. What are the risks? Your doctor will talk with you about risks. These may include: Serious chest pain and heart attack. This is only a risk if the stress portion of the test is done. Fast or uneven heartbeats (palpitations). A feeling of warmth in your chest. This feeling usually does not last long. Allergic reaction to the tracer. Shortness of breath or trouble breathing. What happens before the test? Ask your doctor about changing or stopping your normal medicines. Follow instructions from your doctor about what you cannot eat or drink. Remove your jewelry on the day of the test. Ask your doctor if you need to avoid nicotine or caffeine. What happens during the test? An IV tube will be inserted into one of your veins. Your doctor will give you a small amount of tracer through the IV tube. You will wait for 20-40 minutes while the tracer moves through your bloodstream. Your heart will be monitored with an electrocardiogram (ECG). You will lie down on an exam table. Pictures of your heart will be taken for about 15-20 minutes. You may also have a stress test. For this test, one of these things may be done: You will be asked to exercise on a treadmill or a stationary bike. You will be given medicines that will make your heart work harder. This is done if you are unable to exercise. When blood flow to your heart has peaked, a tracer will again be given through the IV tube. After 20-40 minutes, you will get back on the exam table. More pictures will be taken of your heart. Depending on the tracer that is used, more pictures may need to be taken 3-4 hours later. Your IV tube will be removed when the test is over. The test may vary among doctors and hospitals. What happens after the test? Ask your doctor: Whether you  can return to your normal schedule, including diet, activities, travel, and medicines. Whether you should drink more fluids. This will help to remove the tracer from your body. Ask your doctor, or the department that is doing the test: When will my results be ready? How will I get my results? What are my treatment options? What other tests do I need? What are my next steps? This information is not intended to replace advice given to you by your health care provider. Make sure you discuss any questions you have with your health care provider. Document Revised: 01/03/2022 Document Reviewed: 01/03/2022 Elsevier Patient Education  2023 [employer].

## 2023-04-16 ENCOUNTER — Ambulatory Visit: Payer: BC Managed Care – PPO | Admitting: Physician Assistant

## 2023-04-16 ENCOUNTER — Telehealth (HOSPITAL_COMMUNITY): Payer: Self-pay

## 2023-04-16 NOTE — Telephone Encounter (Signed)
Spoke with the patient, detailed instructions given. He stated that he would be here for his test. Asked to call back with any questions. S.Williams EMTP/CCT 

## 2023-04-17 ENCOUNTER — Ambulatory Visit: Payer: BC Managed Care – PPO | Attending: Cardiology

## 2023-04-17 DIAGNOSIS — R079 Chest pain, unspecified: Secondary | ICD-10-CM

## 2023-04-17 MED ORDER — TECHNETIUM TC 99M TETROFOSMIN IV KIT
10.0000 | PACK | Freq: Once | INTRAVENOUS | Status: AC | PRN
  Administered 2023-04-17: 10 via INTRAVENOUS

## 2023-04-17 MED ORDER — TECHNETIUM TC 99M TETROFOSMIN IV KIT
30.5000 | PACK | Freq: Once | INTRAVENOUS | Status: AC | PRN
  Administered 2023-04-17: 30.5 via INTRAVENOUS

## 2023-04-17 MED ORDER — REGADENOSON 0.4 MG/5ML IV SOLN
0.4000 mg | Freq: Once | INTRAVENOUS | Status: AC
Start: 2023-04-17 — End: 2023-04-17
  Administered 2023-04-17: 0.4 mg via INTRAVENOUS

## 2023-04-20 LAB — MYOCARDIAL PERFUSION IMAGING
LV dias vol: 84 mL (ref 62–150)
LV sys vol: 29 mL
Nuc Stress EF: 66 %
Peak HR: 73 {beats}/min
Rest HR: 54 {beats}/min
Rest Nuclear Isotope Dose: 10 mCi
SDS: 4
SRS: 7
SSS: 11
ST Depression (mm): 0 mm
Stress Nuclear Isotope Dose: 30.5 mCi
TID: 1.08

## 2023-04-25 ENCOUNTER — Ambulatory Visit: Payer: BC Managed Care – PPO

## 2023-04-25 VITALS — BP 112/60 | HR 63 | Ht 70.0 in | Wt 186.2 lb

## 2023-04-25 DIAGNOSIS — R9439 Abnormal result of other cardiovascular function study: Secondary | ICD-10-CM

## 2023-04-25 DIAGNOSIS — I25118 Atherosclerotic heart disease of native coronary artery with other forms of angina pectoris: Secondary | ICD-10-CM

## 2023-04-25 DIAGNOSIS — Z91041 Radiographic dye allergy status: Secondary | ICD-10-CM

## 2023-04-25 DIAGNOSIS — Z01818 Encounter for other preprocedural examination: Secondary | ICD-10-CM | POA: Diagnosis not present

## 2023-04-25 DIAGNOSIS — R0609 Other forms of dyspnea: Secondary | ICD-10-CM | POA: Insufficient documentation

## 2023-04-25 HISTORY — DX: Abnormal result of other cardiovascular function study: R94.39

## 2023-04-25 MED ORDER — PREDNISONE 50 MG PO TABS: ORAL_TABLET | ORAL | 0 refills | Status: DC

## 2023-04-25 NOTE — Progress Notes (Signed)
Cardiology Consultation:    Date:  04/25/2023   ID:  [name], DOB [birth year], MRN [id number]  PCP:  [name], MD  Cardiologist:  Marlyn Corporal Beverely Suen, MD   Referring MD: [name], MD   No chief complaint on file. Abnormal stress test   ASSESSMENT AND PLAN:   [name] 62 year old male patient with history of CAD s/p CABG in 2009, hyperlipidemia now presenting with intermittent chest pain and Lexiscan stress test done 04-17-2023 with moderate ischemia in basal to mid and apical anterolateral segments suggestive of ischemia and overall intermediate risk study.   Problem List Items Addressed This Visit     CAD s/p 5 vessel CABG in 2009; Lexiscan stress MPI moderate ischemia basal to apical anterolateral segments August 2024; with dyspnea on exertion - Primary    No recent transthoracic echocardiogram for evaluation. Symptoms somewhat improved since Imdur was started at last visit.  Reviewed further evaluation with coronary angiogram and possibility of PCI if needed.  Shared Decision Making/Informed Consent{ The risks [stroke (1 in 1000), death (1 in 1000), kidney failure [usually temporary] (1 in 500), bleeding (1 in 200), allergic reaction [possibly serious] (1 in 200)], benefits (diagnostic support and management of coronary artery disease) and alternatives of a cardiac catheterization were discussed in detail with him and he has is willing to proceed.  Advised him to avoid moderate to heavy exertion until the testing is completed. He is aware to call 911 or get to the nearest ER if his symptoms recur and persist and not resolving with rest or 102 sublingual nitroglycerin.  Continue aspirin 81 mg once daily continue Crestor 40 mg once daily. Continue metoprolol tartrate 25 mg twice daily and isosorbide mononitrate 30 mg once daily.  Since he does have contrast dye allergy, he will need premedication [reports hives].       Relevant Orders   ECHOCARDIOGRAM  COMPLETE   Basic metabolic panel   CBC with Differential/Platelet   Dyspnea on exertion   Abnormal nuclear stress test   Other Visit Diagnoses     Pre-procedural examination       Relevant Orders   ECHOCARDIOGRAM COMPLETE   Basic metabolic panel   CBC with Differential/Platelet      Return to clinic based on test results.   History of Present Illness:    [name] is a 62 y.o. male who is being seen today for follow-up to further discuss abnormal stress test results. She recently had a visit in our office on 04-13-2023 with [name], NP.  History of CAD, s/p CABG in 2009, prior stress test December 2017 was reportedly normal, hyperlipidemia [on Repatha and Crestor].  At recent visit for intermittent chest pain, intermittent and random, lasting up to 15 minutes, with occasional response to sublingual nitroglycerin, a Lexiscan stress test with nuclear imaging was ordered.  Lexiscan stress test with nuclear imaging completed 04/17/2023 reported moderate ischemia along the basal to apical anterolateral wall suggestive of intermediate risk study.  ECG portion of the study was reported to be normal.  EF estimated 66%.  Reviewed images myself.  Since the last visit has been started on Imdur 30 mg once daily for few days he had headaches but he seems to have developed tolerance to it able to take the medicine without significant concerns.  He mentions there is some improvement in his overall symptoms. He mentions his functional status has been excellent until the summer where he was able to work in the  yard and exercise regularly.  For few days before his prior visit with [name] NP, he has had symptoms of chest pain.    Good compliance with medications.  Labs recently from PCPs office note sodium 140, potassium 4.6, BUN 16, creatinine 1.16 Hemoglobin A1c 5.8 Lipid panel with total cholesterol 89, LDL 24, HDL 50, triglycerides 69. Past Medical History:  Diagnosis  Date   CAD (coronary artery disease) 07/01/2013   CAD in native artery    CHEST PAIN 02/17/2008   Qualifier: Diagnosis of   By: Cato Mulligan MD, Bruce         CORONARY ARTERY BYPASS GRAFT, HX OF    First degree AV block    Hyperlipidemia 02/15/2007   Qualifier: Diagnosis of   By: Cato Mulligan MD, Bruce         TACHYCARDIA    UNS ADVRS EFF UNS RX MEDICINAL&BIOLOGICAL SBSTNC     Past Surgical History:  Procedure Laterality Date   CORONARY ARTERY BYPASS GRAFT     HAND SURGERY     VASECTOMY     WISDOM TOOTH EXTRACTION      Current Medications: Current Meds  Medication Sig   aspirin EC 81 MG tablet Take 81 mg by mouth daily. Swallow whole.   cholecalciferol (VITAMIN D3) 25 MCG (1000 UNIT) tablet Take 1,000 Units by mouth daily.   Evolocumab (REPATHA SURECLICK) 140 MG/ML SOAJ Inject 140 mg into the skin every 14 (fourteen) days.   isosorbide mononitrate (IMDUR) 30 MG 24 hr tablet Take 1 tablet (30 mg total) by mouth daily.   metoprolol tartrate (LOPRESSOR) 25 MG tablet Take 1 tablet (25 mg total) by mouth 2 (two) times daily.   Multiple Vitamins-Minerals (CENTRUM SILVER PO) Take 1 tablet by mouth daily.     nitroGLYCERIN (NITROSTAT) 0.4 MG SL tablet Place 1 tablet (0.4 mg total) under the tongue every 5 (five) minutes as needed for chest pain (3 doses max).   predniSONE (DELTASONE) 50 MG tablet As directed   rosuvastatin (CRESTOR) 40 MG tablet Take 40 mg by mouth daily.       Allergies:   Codeine sulfate, Iodine, and Oxycodone-acetaminophen   Social History   Socioeconomic History   Marital status: Married    Spouse name: Not on file   Number of children: Not on file   Years of education: Not on file   Highest education level: Not on file  Occupational History   Not on file  Tobacco Use   Smoking status: Never   Smokeless tobacco: Never  Vaping Use   Vaping status: Never Used  Substance and Sexual Activity   Alcohol use: Never   Drug use: Never   Sexual activity: Not on file   Other Topics Concern   Not on file  Social History Narrative   Not on file   Social Determinants of Health   Financial Resource Strain: Not on file  Food Insecurity: Not on file  Transportation Needs: Not on file  Physical Activity: Not on file  Stress: Not on file  Social Connections: Not on file     Family History: The patient's family history includes Coronary artery disease in his father; Ovarian cancer in his mother; Valvular heart disease in his mother. ROS:   Please see the history of present illness.    All 14 point review of systems negative except as described per history of present illness.  EKGs/Labs/Other Studies Reviewed:    The following studies were reviewed today:   EKG:  Recent Labs: No results found for requested labs within last 365 days.  Recent Lipid Panel    Component Value Date/Time   CHOL 176 07/01/2008 0935   TRIG 136 07/01/2008 0935   HDL 32.5 (L) 07/01/2008 0935   CHOLHDL 5.4 CALC 07/01/2008 0935   VLDL 27 07/01/2008 0935   LDLCALC 116 (H) 07/01/2008 0935   LDLDIRECT 149.9 02/03/2008 1006    Physical Exam:    VS:  BP 112/60 (BP Location: Left Arm, Patient Position: Sitting, Cuff Size: Normal)   Pulse 63   Ht 5\' 10"  (1.778 m)   Wt 186 lb 3.2 oz (84.5 kg)   SpO2 97%   BMI 26.72 kg/m     Wt Readings from Last 3 Encounters:  04/25/23 186 lb 3.2 oz (84.5 kg)  04/17/23 187 lb (84.8 kg)  04/13/23 187 lb 3.2 oz (84.9 kg)     GENERAL:  Well nourished, well developed in no acute distress NECK: No JVD; No carotid bruits CARDIAC: RRR, S1 and S2 present, no murmurs, no rubs, no gallops CHEST:  Clear to auscultation without rales, wheezing or rhonchi  Extremities: No pitting pedal edema. Pulses bilaterally symmetric with radial 2+ and dorsalis pedis 2+ NEUROLOGIC:  Alert and oriented x 3  Medication Adjustments/Labs and Tests Ordered: Current medicines are reviewed at length with the patient today.  Concerns regarding medicines  are outlined above.  Orders Placed This Encounter  Procedures   Basic metabolic panel   CBC with Differential/Platelet   ECHOCARDIOGRAM COMPLETE   Meds ordered this encounter  Medications   predniSONE (DELTASONE) 50 MG tablet    Sig: As directed    Dispense:  3 tablet    Refill:  0    Signed, Javaria Knapke reddy Lerone Onder, MD, MPH, [facility]. 04/25/2023 12:38 PM    [facility]

## 2023-04-25 NOTE — H&P (View-Only) (Signed)
Cardiology Consultation:    Date:  04/25/2023   ID:  [name], DOB [birth year], MRN [id number]  PCP:  Derrick Greenspan, MD  Cardiologist:  Derrick Corporal Beverely Suen, MD   Referring MD: Derrick Greenspan, MD   No chief complaint on file. Abnormal stress test   ASSESSMENT AND PLAN:   [name] 62 year old male patient with history of CAD s/p CABG in 2009, hyperlipidemia now presenting with intermittent chest pain and Lexiscan stress test done 04-17-2023 with moderate ischemia in basal to mid and apical anterolateral segments suggestive of ischemia and overall intermediate risk study.   Problem List Items Addressed This Visit     CAD s/p 5 vessel CABG in 2009; Lexiscan stress MPI moderate ischemia basal to apical anterolateral segments August 2024; with dyspnea on exertion - Primary    No recent transthoracic echocardiogram for evaluation. Symptoms somewhat improved since Imdur was started at last visit.  Reviewed further evaluation with coronary angiogram and possibility of PCI if needed.  Shared Decision Making/Informed Consent{ The risks [stroke (1 in 1000), death (1 in 1000), kidney failure [usually temporary] (1 in 500), bleeding (1 in 200), allergic reaction [possibly serious] (1 in 200)], benefits (diagnostic support and management of coronary artery disease) and alternatives of a cardiac catheterization were discussed in detail with him and he has is willing to proceed.  Advised him to avoid moderate to heavy exertion until the testing is completed. He is aware to call 911 or get to the nearest ER if his symptoms recur and persist and not resolving with rest or 102 sublingual nitroglycerin.  Continue aspirin 81 mg once daily continue Crestor 40 mg once daily. Continue metoprolol tartrate 25 mg twice daily and isosorbide mononitrate 30 mg once daily.  Since he does have contrast dye allergy, he will need premedication [reports hives].       Relevant Orders   ECHOCARDIOGRAM  COMPLETE   Basic metabolic panel   CBC with Differential/Platelet   Dyspnea on exertion   Abnormal nuclear stress test   Other Visit Diagnoses     Pre-procedural examination       Relevant Orders   ECHOCARDIOGRAM COMPLETE   Basic metabolic panel   CBC with Differential/Platelet      Return to clinic based on test results.   History of Present Illness:    [name] is a 62 y.o. male who is being seen today for follow-up to further discuss abnormal stress test results. She recently had a visit in our office on 04-13-2023 with Derrick Bamberg, NP.  History of CAD, s/p CABG in 2009, prior stress test December 2017 was reportedly normal, hyperlipidemia [on Repatha and Crestor].  At recent visit for intermittent chest pain, intermittent and random, lasting up to 15 minutes, with occasional response to sublingual nitroglycerin, a Lexiscan stress test with nuclear imaging was ordered.  Lexiscan stress test with nuclear imaging completed 04/17/2023 reported moderate ischemia along the basal to apical anterolateral wall suggestive of intermediate risk study.  ECG portion of the study was reported to be normal.  EF estimated 66%.  Reviewed images myself.  Since the last visit has been started on Imdur 30 mg once daily for few days he had headaches but he seems to have developed tolerance to it able to take the medicine without significant concerns.  He mentions there is some improvement in his overall symptoms. He mentions his functional status has been excellent until the summer where he was able to work in the  yard and exercise regularly.  For few days before his prior visit with Derrick Bamberg NP, he has had symptoms of chest pain.    Good compliance with medications.  Labs recently from PCPs office note sodium 140, potassium 4.6, BUN 16, creatinine 1.16 Hemoglobin A1c 5.8 Lipid panel with total cholesterol 89, LDL 24, HDL 50, triglycerides 69. Past Medical History:  Diagnosis  Date   CAD (coronary artery disease) 07/01/2013   CAD in native artery    CHEST PAIN 02/17/2008   Qualifier: Diagnosis of   By: Cato Mulligan MD, Bruce         CORONARY ARTERY BYPASS GRAFT, HX OF    First degree AV block    Hyperlipidemia 02/15/2007   Qualifier: Diagnosis of   By: Cato Mulligan MD, Bruce         TACHYCARDIA    UNS ADVRS EFF UNS RX MEDICINAL&BIOLOGICAL SBSTNC     Past Surgical History:  Procedure Laterality Date   CORONARY ARTERY BYPASS GRAFT     HAND SURGERY     VASECTOMY     WISDOM TOOTH EXTRACTION      Current Medications: Current Meds  Medication Sig   aspirin EC 81 MG tablet Take 81 mg by mouth daily. Swallow whole.   cholecalciferol (VITAMIN D3) 25 MCG (1000 UNIT) tablet Take 1,000 Units by mouth daily.   Evolocumab (REPATHA SURECLICK) 140 MG/ML SOAJ Inject 140 mg into the skin every 14 (fourteen) days.   isosorbide mononitrate (IMDUR) 30 MG 24 hr tablet Take 1 tablet (30 mg total) by mouth daily.   metoprolol tartrate (LOPRESSOR) 25 MG tablet Take 1 tablet (25 mg total) by mouth 2 (two) times daily.   Multiple Vitamins-Minerals (CENTRUM SILVER PO) Take 1 tablet by mouth daily.     nitroGLYCERIN (NITROSTAT) 0.4 MG SL tablet Place 1 tablet (0.4 mg total) under the tongue every 5 (five) minutes as needed for chest pain (3 doses max).   predniSONE (DELTASONE) 50 MG tablet As directed   rosuvastatin (CRESTOR) 40 MG tablet Take 40 mg by mouth daily.       Allergies:   Codeine sulfate, Iodine, and Oxycodone-acetaminophen   Social History   Socioeconomic History   Marital status: Married    Spouse name: Not on file   Number of children: Not on file   Years of education: Not on file   Highest education level: Not on file  Occupational History   Not on file  Tobacco Use   Smoking status: Never   Smokeless tobacco: Never  Vaping Use   Vaping status: Never Used  Substance and Sexual Activity   Alcohol use: Never   Drug use: Never   Sexual activity: Not on file   Other Topics Concern   Not on file  Social History Narrative   Not on file   Social Determinants of Health   Financial Resource Strain: Not on file  Food Insecurity: Not on file  Transportation Needs: Not on file  Physical Activity: Not on file  Stress: Not on file  Social Connections: Not on file     Family History: The patient's family history includes Coronary artery disease in his father; Ovarian cancer in his mother; Valvular heart disease in his mother. ROS:   Please see the history of present illness.    All 14 point review of systems negative except as described per history of present illness.  EKGs/Labs/Other Studies Reviewed:    The following studies were reviewed today:   EKG:  Recent Labs: No results found for requested labs within last 365 days.  Recent Lipid Panel    Component Value Date/Time   CHOL 176 07/01/2008 0935   TRIG 136 07/01/2008 0935   HDL 32.5 (L) 07/01/2008 0935   CHOLHDL 5.4 CALC 07/01/2008 0935   VLDL 27 07/01/2008 0935   LDLCALC 116 (H) 07/01/2008 0935   LDLDIRECT 149.9 02/03/2008 1006    Physical Exam:    VS:  BP 112/60 (BP Location: Left Arm, Patient Position: Sitting, Cuff Size: Normal)   Pulse 63   Ht 5\' 10"  (1.778 m)   Wt 186 lb 3.2 oz (84.5 kg)   SpO2 97%   BMI 26.72 kg/m     Wt Readings from Last 3 Encounters:  04/25/23 186 lb 3.2 oz (84.5 kg)  04/17/23 187 lb (84.8 kg)  04/13/23 187 lb 3.2 oz (84.9 kg)     GENERAL:  Well nourished, well developed in no acute distress NECK: No JVD; No carotid bruits CARDIAC: RRR, S1 and S2 present, no murmurs, no rubs, no gallops CHEST:  Clear to auscultation without rales, wheezing or rhonchi  Extremities: No pitting pedal edema. Pulses bilaterally symmetric with radial 2+ and dorsalis pedis 2+ NEUROLOGIC:  Alert and oriented x 3  Medication Adjustments/Labs and Tests Ordered: Current medicines are reviewed at length with the patient today.  Concerns regarding medicines  are outlined above.  Orders Placed This Encounter  Procedures   Basic metabolic panel   CBC with Differential/Platelet   ECHOCARDIOGRAM COMPLETE   Meds ordered this encounter  Medications   predniSONE (DELTASONE) 50 MG tablet    Sig: As directed    Dispense:  3 tablet    Refill:  0    Signed, Javaria Knapke reddy Lerone Onder, MD, MPH, [facility]. 04/25/2023 12:38 PM    [facility]

## 2023-04-25 NOTE — Patient Instructions (Signed)
Medication Instructions: Your physician recommends that you continue on your current medications as directed. Please refer to the Current Medication list given to you today.     *If you need a refill on your cardiac medications before your next appointment, please call your pharmacy*   Lab Work: Your physician recommends that you return for lab work in: Today for BMP and CBC  If you have labs (blood work) drawn today and your tests are completely normal, you will receive your results only by: MyChart Message (if you have MyChart) OR A paper copy in the mail If you have any lab test that is abnormal or we need to change your treatment, we will call you to review the results.   Testing/Procedures: Your physician has requested that you have an echocardiogram. Echocardiography is a painless test that uses sound waves to create images of your heart. It provides your doctor with information about the size and shape of your heart and how well your heart's chambers and valves are working. This procedure takes approximately one hour. There are no restrictions for this procedure. Please do NOT wear cologne, perfume, aftershave, or lotions (deodorant is allowed). Please arrive 15 minutes prior to your appointment time.     [facility] A DEPT OF [facility] [address] [place] [address] Dept: [phone] Loc: [phone]  Darick Safer  04/25/2023  You are scheduled for a Cardiac Catheterization on Friday, September 6 with Dr. Tonny Bollman.  1. Please arrive at the [facility] (Main Entrance A) at [facility]: [address] [place], [place] [address] at 11:30 AM (This time is 2 hour(s) before your procedure to ensure your preparation). Free valet parking service is available. You will check in at ADMITTING. The support person will be asked to wait in the waiting room.  It is OK to have someone drop you  off and come back when you are ready to be discharged.    Special note: Every effort is made to have your procedure done on time. Please understand that emergencies sometimes delay scheduled procedures.  2. Diet: Do not eat solid foods after midnight.  The patient may have clear liquids until 5am upon the day of the procedure.   4. Medication instructions in preparation for your procedure:   Contrast Allergy: Yes, Please take Prednisone 50mg  by mouth at: Thirteen hours prior to cath 12:00am on Thursday Seven hours prior to cath 7:00am on Friday And prior to leaving home please take last dose of Prednisone 50mg  and Benadryl 50mg  by mouth.      On the morning of your procedure, take your Aspirin 81 mg and any morning medicines NOT listed above.  You may use sips of water.  5. Plan to go home the same day, you will only stay overnight if medically necessary. 6. Bring a current list of your medications and current insurance cards. 7. You MUST have a responsible person to drive you home. 8. Someone MUST be with you the first 24 hours after you arrive home or your discharge will be delayed. 9. Please wear clothes that are easy to get on and off and wear slip-on shoes.  Thank you for allowing [place] to care for you!   -- [facility]    Follow-Up: At [facility], you and your health needs are our priority.  As part of our continuing mission to provide you with exceptional  heart care, we have created designated Provider Care Teams.  These Care Teams include your primary Cardiologist (physician) and Advanced Practice Providers (APPs -  Physician Assistants and Nurse Practitioners) who all work together to provide you with the care you need, when you need it.  We recommend signing up for the patient portal called "MyChart".  Sign up information is provided on this After Visit Summary.  MyChart is used to connect with patients for Virtual Visits  (Telemedicine).  Patients are able to view lab/test results, encounter notes, upcoming appointments, etc.  Non-urgent messages can be sent to your provider as well.   To learn more about what you can do with MyChart, go to [email or web address].    Your next appointment:   2 week(s)  Provider:   Huntley Dec, MD    Other Instructions

## 2023-04-25 NOTE — Assessment & Plan Note (Signed)
No recent transthoracic echocardiogram for evaluation. Symptoms somewhat improved since Imdur was started at last visit.  Reviewed further evaluation with coronary angiogram and possibility of PCI if needed.  Shared Decision Making/Informed Consent{ The risks [stroke (1 in 1000), death (1 in 1000), kidney failure [usually temporary] (1 in 500), bleeding (1 in 200), allergic reaction [possibly serious] (1 in 200)], benefits (diagnostic support and management of coronary artery disease) and alternatives of a cardiac catheterization were discussed in detail with [name] and he has is willing to proceed.  Advised [name] to avoid moderate to heavy exertion until the testing is completed. He is aware to call 911 or get to the nearest ER if his symptoms recur and persist and not resolving with rest or 102 sublingual nitroglycerin.  Continue aspirin 81 mg once daily continue Crestor 40 mg once daily. Continue metoprolol tartrate 25 mg twice daily and isosorbide mononitrate 30 mg once daily.  Since he does have contrast dye allergy, he will need premedication [reports hives].

## 2023-04-25 NOTE — Addendum Note (Signed)
Addended by: Huntley Dec REDDY on: 04/25/2023 02:43 PM   Modules accepted: Orders

## 2023-04-26 ENCOUNTER — Telehealth: Payer: Self-pay | Admitting: *Deleted

## 2023-04-26 ENCOUNTER — Ambulatory Visit (HOSPITAL_COMMUNITY): Payer: BC Managed Care – PPO | Attending: Internal Medicine

## 2023-04-26 DIAGNOSIS — Z01818 Encounter for other preprocedural examination: Secondary | ICD-10-CM | POA: Insufficient documentation

## 2023-04-26 DIAGNOSIS — I25118 Atherosclerotic heart disease of native coronary artery with other forms of angina pectoris: Secondary | ICD-10-CM | POA: Insufficient documentation

## 2023-04-26 LAB — BASIC METABOLIC PANEL
BUN/Creatinine Ratio: 15 (ref 10–24)
BUN: 14 mg/dL (ref 8–27)
CO2: 27 mmol/L (ref 20–29)
Calcium: 9.6 mg/dL (ref 8.6–10.2)
Chloride: 101 mmol/L (ref 96–106)
Creatinine, Ser: 0.94 mg/dL (ref 0.76–1.27)
Glucose: 67 mg/dL — ABNORMAL LOW (ref 70–99)
Potassium: 4.2 mmol/L (ref 3.5–5.2)
Sodium: 141 mmol/L (ref 134–144)
eGFR: 92 mL/min/{1.73_m2} (ref >59)

## 2023-04-26 LAB — CBC WITH DIFFERENTIAL/PLATELET
Basophils Absolute: 0.1 10*3/uL (ref 0.0–0.2)
Basos: 1 %
EOS (ABSOLUTE): 0.1 10*3/uL (ref 0.0–0.4)
Eos: 1 %
Hematocrit: 51.2 % — ABNORMAL HIGH (ref 37.5–51.0)
Hemoglobin: 17.1 g/dL (ref 13.0–17.7)
Immature Grans (Abs): 0 10*3/uL (ref 0.0–0.1)
Immature Granulocytes: 0 %
Lymphocytes Absolute: 1.4 10*3/uL (ref 0.7–3.1)
Lymphs: 14 %
MCH: 31.3 pg (ref 26.6–33.0)
MCHC: 33.4 g/dL (ref 31.5–35.7)
MCV: 94 fL (ref 79–97)
Monocytes Absolute: 1.3 10*3/uL — ABNORMAL HIGH (ref 0.1–0.9)
Monocytes: 13 %
Neutrophils Absolute: 7.5 10*3/uL — ABNORMAL HIGH (ref 1.4–7.0)
Neutrophils: 71 %
Platelets: 289 10*3/uL (ref 150–450)
RBC: 5.47 x10E6/uL (ref 4.14–5.80)
RDW: 12.1 % (ref 11.6–15.4)
WBC: 10.5 10*3/uL (ref 3.4–10.8)

## 2023-04-26 LAB — ECHOCARDIOGRAM COMPLETE
Area-P 1/2: 3.19 cm2
Est EF: 55
S' Lateral: 2.3 cm

## 2023-04-26 NOTE — Telephone Encounter (Addendum)
Cardiac Catheterization scheduled at [facility] for: Friday April 27, 2023 1:30 PM Arrival time [facility] Main Entrance A at: 11:30 AM  Nothing to eat after midnight prior to procedure, clear liquids until 5 AM day of procedure.  CONTRAST ALLERGY: 13 hour Prednisone and Benadryl Prep: 04/27/23  Prednisone 50 mg 12:30 AM 04/27/23 Prednisone 50 mg 6:30 AM 04/27/23  Prednisone 50 mg and Benadryl 50 mg -just prior to leaving home for hospital  Medication instructions: -Usual morning medications can be taken with sips of water including aspirin 81 mg.  Plan to go home the same day, you will only stay overnight if medically necessary.  You must have responsible adult to drive you home.  Someone must be with you the first 24 hours after you arrive home.  Reviewed procedure instructions with patient.

## 2023-04-27 ENCOUNTER — Other Ambulatory Visit (HOSPITAL_COMMUNITY): Payer: Self-pay

## 2023-04-27 ENCOUNTER — Ambulatory Visit (HOSPITAL_COMMUNITY)
Admission: RE | Admit: 2023-04-27 | Discharge: 2023-04-27 | Disposition: A | Discharge status: Home / Self Care | Payer: BC Managed Care – PPO | Source: Ambulatory Visit | Attending: Cardiovascular Disease | Admitting: Cardiovascular Disease

## 2023-04-27 ENCOUNTER — Encounter (HOSPITAL_COMMUNITY)
Admission: RE | Disposition: A | Discharge status: Home / Self Care | Payer: Self-pay | Source: Ambulatory Visit | Attending: Cardiovascular Disease

## 2023-04-27 ENCOUNTER — Other Ambulatory Visit: Payer: Self-pay

## 2023-04-27 DIAGNOSIS — R079 Chest pain, unspecified: Secondary | ICD-10-CM | POA: Diagnosis present

## 2023-04-27 DIAGNOSIS — Z91041 Radiographic dye allergy status: Secondary | ICD-10-CM | POA: Insufficient documentation

## 2023-04-27 DIAGNOSIS — Z955 Presence of coronary angioplasty implant and graft: Secondary | ICD-10-CM | POA: Diagnosis not present

## 2023-04-27 DIAGNOSIS — Z79899 Other long term (current) drug therapy: Secondary | ICD-10-CM | POA: Diagnosis not present

## 2023-04-27 DIAGNOSIS — R0609 Other forms of dyspnea: Secondary | ICD-10-CM | POA: Insufficient documentation

## 2023-04-27 DIAGNOSIS — Z7982 Long term (current) use of aspirin: Secondary | ICD-10-CM | POA: Diagnosis not present

## 2023-04-27 DIAGNOSIS — E785 Hyperlipidemia, unspecified: Secondary | ICD-10-CM | POA: Diagnosis not present

## 2023-04-27 DIAGNOSIS — R9439 Abnormal result of other cardiovascular function study: Secondary | ICD-10-CM | POA: Diagnosis present

## 2023-04-27 DIAGNOSIS — Z951 Presence of aortocoronary bypass graft: Secondary | ICD-10-CM | POA: Diagnosis not present

## 2023-04-27 DIAGNOSIS — Z01818 Encounter for other preprocedural examination: Secondary | ICD-10-CM

## 2023-04-27 DIAGNOSIS — I2510 Atherosclerotic heart disease of native coronary artery without angina pectoris: Secondary | ICD-10-CM | POA: Diagnosis not present

## 2023-04-27 DIAGNOSIS — I2582 Chronic total occlusion of coronary artery: Secondary | ICD-10-CM | POA: Diagnosis not present

## 2023-04-27 DIAGNOSIS — I25118 Atherosclerotic heart disease of native coronary artery with other forms of angina pectoris: Secondary | ICD-10-CM

## 2023-04-27 HISTORY — PX: CORONARY PRESSURE/FFR STUDY: CATH118243

## 2023-04-27 HISTORY — PX: CORONARY STENT INTERVENTION: CATH118234

## 2023-04-27 HISTORY — PX: LEFT HEART CATH AND CORS/GRAFTS ANGIOGRAPHY: CATH118250

## 2023-04-27 LAB — POCT ACTIVATED CLOTTING TIME
Activated Clotting Time: 244 s
Activated Clotting Time: 281 s

## 2023-04-27 SURGERY — LEFT HEART CATH AND CORS/GRAFTS ANGIOGRAPHY
Anesthesia: LOCAL

## 2023-04-27 MED ORDER — CLOPIDOGREL BISULFATE 75 MG PO TABS: 75.0000 mg | ORAL_TABLET | Freq: Every day | ORAL | 0 refills | Status: DC

## 2023-04-27 MED ORDER — SODIUM CHLORIDE 0.9 % WEIGHT BASED INFUSION: 1.0000 mL/kg/h | INTRAVENOUS | Status: DC

## 2023-04-27 MED ORDER — SODIUM CHLORIDE 0.9% FLUSH: 3.0000 mL | Freq: Two times a day (BID) | INTRAVENOUS | Status: DC

## 2023-04-27 MED ORDER — ONDANSETRON HCL 4 MG/2ML IJ SOLN: 4.0000 mg | Freq: Four times a day (QID) | INTRAMUSCULAR | Status: DC | PRN

## 2023-04-27 MED ORDER — SODIUM CHLORIDE 0.9% FLUSH: 3.0000 mL | INTRAVENOUS | Status: DC | PRN

## 2023-04-27 MED ORDER — DIPHENHYDRAMINE HCL 50 MG/ML IJ SOLN: 50.0000 mg | Freq: Once | INTRAMUSCULAR | Status: DC

## 2023-04-27 MED ORDER — CLOPIDOGREL BISULFATE 75 MG PO TABS: 75.0000 mg | ORAL_TABLET | Freq: Every day | ORAL | Status: DC

## 2023-04-27 MED ORDER — HEPARIN SODIUM (PORCINE) 1000 UNIT/ML IJ SOLN
INTRAMUSCULAR | Status: DC | PRN
  Administered 2023-04-27: 3000 [IU] via INTRAVENOUS
  Administered 2023-04-27: 7000 [IU] via INTRAVENOUS

## 2023-04-27 MED ORDER — HYDRALAZINE HCL 20 MG/ML IJ SOLN: 10.0000 mg | INTRAMUSCULAR | Status: DC | PRN

## 2023-04-27 MED ORDER — IOHEXOL 350 MG/ML SOLN
INTRAVENOUS | Status: DC | PRN
  Administered 2023-04-27: 135 mL

## 2023-04-27 MED ORDER — CLOPIDOGREL BISULFATE 75 MG PO TABS
75.0000 mg | ORAL_TABLET | Freq: Every day | ORAL | 0 refills | Status: DC
  Filled 2023-04-27: qty 30, 30d supply, fill #0

## 2023-04-27 MED ORDER — LIDOCAINE HCL (PF) 1 % IJ SOLN
INTRAMUSCULAR | Status: DC | PRN
  Administered 2023-04-27: 5 mL

## 2023-04-27 MED ORDER — SODIUM CHLORIDE 0.9 % IV SOLN: INTRAVENOUS | Status: DC

## 2023-04-27 MED ORDER — CLOPIDOGREL BISULFATE 300 MG PO TABS
ORAL_TABLET | ORAL | Status: DC | PRN
  Administered 2023-04-27: 600 mg via ORAL

## 2023-04-27 MED ORDER — HEPARIN (PORCINE) IN NACL 1000-0.9 UT/500ML-% IV SOLN
INTRAVENOUS | Status: DC | PRN
  Administered 2023-04-27 (×2): 500 mL

## 2023-04-27 MED ORDER — NITROGLYCERIN 1 MG/10 ML FOR IR/CATH LAB
INTRA_ARTERIAL | Status: DC | PRN
  Administered 2023-04-27 (×2): 150 ug via INTRACORONARY

## 2023-04-27 MED ORDER — FAMOTIDINE IN NACL 20-0.9 MG/50ML-% IV SOLN
INTRAVENOUS | Status: AC
  Administered 2023-04-27: 20 mg
  Filled 2023-04-27: qty 50

## 2023-04-27 MED ORDER — FENTANYL CITRATE (PF) 100 MCG/2ML IJ SOLN
INTRAMUSCULAR | Status: DC | PRN
  Administered 2023-04-27 (×2): 25 ug via INTRAVENOUS

## 2023-04-27 MED ORDER — FENTANYL CITRATE (PF) 100 MCG/2ML IJ SOLN
INTRAMUSCULAR | Status: AC
  Filled 2023-04-27: qty 2

## 2023-04-27 MED ORDER — DIPHENHYDRAMINE HCL 25 MG PO CAPS: 50.0000 mg | ORAL_CAPSULE | Freq: Once | ORAL | Status: DC

## 2023-04-27 MED ORDER — SODIUM CHLORIDE 0.9 % IV SOLN: 250.0000 mL | INTRAVENOUS | Status: DC | PRN

## 2023-04-27 MED ORDER — SODIUM CHLORIDE 0.9 % WEIGHT BASED INFUSION
3.0000 mL/kg/h | INTRAVENOUS | Status: AC
  Administered 2023-04-27: 3 mL/kg/h via INTRAVENOUS

## 2023-04-27 MED ORDER — MIDAZOLAM HCL 2 MG/2ML IJ SOLN
INTRAMUSCULAR | Status: DC | PRN
  Administered 2023-04-27: 2 mg via INTRAVENOUS
  Administered 2023-04-27: 1 mg via INTRAVENOUS

## 2023-04-27 MED ORDER — LIDOCAINE HCL (PF) 1 % IJ SOLN
INTRAMUSCULAR | Status: AC
  Filled 2023-04-27: qty 30

## 2023-04-27 MED ORDER — MIDAZOLAM HCL 2 MG/2ML IJ SOLN
INTRAMUSCULAR | Status: AC
  Filled 2023-04-27: qty 2

## 2023-04-27 MED ORDER — LABETALOL HCL 5 MG/ML IV SOLN: 10.0000 mg | INTRAVENOUS | Status: DC | PRN

## 2023-04-27 MED ORDER — NITROGLYCERIN 1 MG/10 ML FOR IR/CATH LAB
INTRA_ARTERIAL | Status: AC
  Filled 2023-04-27: qty 10

## 2023-04-27 SURGICAL SUPPLY — 23 items
BALLN EMERGE MR 2.5X15 (BALLOONS) ×1
BALLN ~~LOC~~ EUPHORA RX 2.75X15 (BALLOONS) ×1
BALLOON EMERGE MR 2.5X15 (BALLOONS) IMPLANT
BALLOON ~~LOC~~ EUPHORA RX 2.75X15 (BALLOONS) IMPLANT
CATH INFINITI 5 FR MPA2 (CATHETERS) IMPLANT
CATH INFINITI 5FR MULTPACK ANG (CATHETERS) IMPLANT
CATH LAUNCHER 6FR EBU3.5 (CATHETERS) IMPLANT
CLOSURE PERCLOSE PROSTYLE (VASCULAR PRODUCTS) IMPLANT
ELECT DEFIB PAD ADLT CADENCE (PAD) IMPLANT
GUIDEWIRE PRESSURE X 175 (WIRE) IMPLANT
KIT ENCORE 26 ADVANTAGE (KITS) IMPLANT
KIT HEMO VALVE WATCHDOG (MISCELLANEOUS) IMPLANT
KIT MICROPUNCTURE NIT STIFF (SHEATH) IMPLANT
PACK CARDIAC CATHETERIZATION (CUSTOM PROCEDURE TRAY) ×1 IMPLANT
SET ATX-X65L (MISCELLANEOUS) IMPLANT
SHEATH PINNACLE 5F 10CM (SHEATH) IMPLANT
SHEATH PINNACLE 6F 10CM (SHEATH) IMPLANT
SHEATH PROBE COVER 6X72 (BAG) IMPLANT
STENT SYNERGY XD 2.50X24 (Permanent Stent) IMPLANT
SYNERGY XD 2.50X24 (Permanent Stent) ×1 IMPLANT
TUBING CIL FLEX 10 FLL-RA (TUBING) IMPLANT
WIRE EMERALD 3MM-J .035X150CM (WIRE) IMPLANT
WIRE EMERALD 3MM-J .035X260CM (WIRE) IMPLANT

## 2023-04-27 NOTE — Interval H&P Note (Signed)
History and Physical Interval Note:  04/27/2023 1:59 PM  [name]  has presented today for surgery, with the diagnosis of abnormal stress test - angina.  The various methods of treatment have been discussed with the patient and family. After consideration of risks, benefits and other options for treatment, the patient has consented to  Procedure(s): LEFT HEART CATH AND CORS/GRAFTS ANGIOGRAPHY (N/A) as a surgical intervention.  The patient's history has been reviewed, patient examined, no change in status, stable for surgery.  I have reviewed the patient's chart and labs.  Questions were answered to the patient's satisfaction.     Tonny Bollman

## 2023-04-27 NOTE — Discharge Instructions (Addendum)
Femoral Site Care This sheet gives you information about how to care for yourself after your procedure. Your health care provider may also give you more specific instructions. If you have problems or questions, contact your health care provider. What can I expect after the procedure? After the procedure, it is common to have: Bruising that usually fades within 1-2 weeks. Tenderness at the site. Follow these instructions at home: Wound care May remove bandage after 24 hours. Do not take baths, swim, or use a hot tub for 5 days. You may shower 24-48 hours after the procedure. Gently wash the site with plain soap and water. Pat the area dry with a clean towel. Do not rub the site. This may cause bleeding. Do not apply powder or lotion to the site. Keep the site clean and dry. Check your femoral site every day for signs of infection. Check for: Redness, swelling, or pain. Fluid or blood. Warmth. Pus or a bad smell. Activity For the first 2-3 days after your procedure, or as long as directed: Avoid climbing stairs as much as possible. Do not squat. Do not lift, push or pull anything that is heavier than 10 lb for 5 days. Rest as directed. Avoid sitting for a long time without moving. Get up to take short walks every 1-2 hours. Do not drive for 24 hours. General instructions Take over-the-counter and prescription medicines only as told by your health care provider. Keep all follow-up visits as told by your health care provider. This is important. DRINK PLENTY OF FLUIDS FOR THE NEXT 2-3 DAYS. Contact a health care provider if you have: A fever or chills. You have redness, swelling, or pain around your insertion site. Get help right away if: The catheter insertion area swells very fast. You pass out. You suddenly start to sweat or your skin gets clammy. The catheter insertion area is bleeding, and the bleeding does not stop when you hold steady pressure on the area. The area near or  just beyond the catheter insertion site becomes pale, cool, tingly, or numb. These symptoms may represent a serious problem that is an emergency. Do not wait to see if the symptoms will go away. Get medical help right away. Call your local emergency services (911 in the [place] Do not drive yourself to the hospital. Summary After the procedure, it is common to have bruising that usually fades within 1-2 weeks. Check your femoral site every day for signs of infection. Do not lift, push or pull anything that is heavier than 10 lb for 5 days.  This information is not intended to replace advice given to you by your health care provider. Make sure you discuss any questions you have with your health care provider. Document Revised: 08/20/2017 Document Reviewed: 08/20/2017 Elsevier Patient Education  2020 [employer]  

## 2023-04-27 NOTE — Progress Notes (Signed)
Patient walked to the bathroom without difficulties. groin site level 0, clean, dry and intact.

## 2023-04-27 NOTE — Discharge Summary (Signed)
Discharge Summary for Same Day PCI   Patient ID: [name] MRN: [id number]; DOB: 1961-05-06  Admit date: 04/27/2023 Discharge date: 04/27/2023  Primary Care Provider: [name], MD  Primary Cardiologist: [name], MD  Primary Electrophysiologist:  None   Discharge Diagnoses    Active Problems:   CHEST PAIN  Diagnostic Studies/Procedures    Cardiac Catheterization 04/27/2023:    Origin to Prox Graft lesion is 100% stenosed.   Origin to Prox Graft lesion is 100% stenosed.   Prox LAD to Mid LAD lesion is 100% stenosed.   Prox RCA lesion is 100% stenosed.   Ost Cx to Prox Cx lesion is 90% stenosed with 0% stenosed side branch in 1st Mrg.   Non-stenotic Prox LAD lesion with 80% stenosed side branch in 1st Diag.   A drug-eluting stent was successfully placed using a SYNERGY XD 2.50X24.   Post intervention, there is a 0% residual stenosis.   Post intervention, the side branch was reduced to 0% residual stenosis.  Diagnostic Dominance: Right  Intervention   _____________   History of Present Illness     [name] is a 62 y.o. male with PMH of CAD s/p 5v CABG '09, HLD who presented to the office on 9/4. He was recently seen with complaints of chest pain and underwent lexiscan myoveiw 8/27 showing moderate ischemia in the basal to mid and apical anterolateral segments, intermediate risk. At this follow up, he did report some symptoms relief with Imdur. Was set up for outpatient cardiac cath.    Hospital Course     The patient underwent cardiac cath as noted above with patent LIMA-LAD, SVG-OM1, occluded SVG-RCA and SVG-diag. Successful PCI/DES to 1 diag. Plan for DAPT with ASA/plavix for at least 6 months. The patient was seen by cardiac rehab while in short stay. There were no observed complications post cath. R groin cath site was re-evaluated prior to discharge and found to be stable without any complications. Instructions/precautions regarding cath  site care were given prior to discharge.  Ulice Dash was seen by Dr. Excell Seltzer and determined stable for discharge home. Follow up with our office has been arranged. Medications are listed below. Pertinent changes include addition of plavix. Patient had headache with Imdur, Imdur was discontinued after PCI.     _____________  Cath/PCI Registry Performance & Quality Measures: Aspirin prescribed? - Yes ADP Receptor Inhibitor (Plavix/Clopidogrel, Brilinta/Ticagrelor or Effient/Prasugrel) prescribed (includes medically managed patients)? - Yes High Intensity Statin (Lipitor 40-80mg  or Crestor 20-40mg ) prescribed? - No - on repatha For EF <40%, was ACEI/ARB prescribed? - Not Applicable (EF >/= 40%) For EF <40%, Aldosterone Antagonist (Spironolactone or Eplerenone) prescribed? - Not Applicable (EF >/= 40%) Cardiac Rehab Phase II ordered (Included Medically managed Patients)? - Yes  _____________   Discharge Vitals Blood pressure 133/68, pulse 84, temperature 97.8 F (36.6 C), temperature source Oral, resp. rate 18, height 5\' 10"  (1.778 m), weight 83 kg, SpO2 94%.  Filed Weights   04/27/23 1206  Weight: 83 kg    Last Labs & Radiologic Studies    CBC Recent Labs    04/25/23 1106  WBC 10.5  NEUTROABS 7.5*  HGB 17.1  HCT 51.2*  MCV 94  PLT 289   Basic Metabolic Panel Recent Labs    25/95/63 1106  NA 141  K 4.2  CL 101  CO2 27  GLUCOSE 67*  BUN 14  CREATININE 0.94  CALCIUM 9.6   Liver Function Tests No results for input(s): "  AST", "ALT", "ALKPHOS", "BILITOT", "PROT", "ALBUMIN" in the last 72 hours. No results for input(s): "LIPASE", "AMYLASE" in the last 72 hours. High Sensitivity Troponin:   No results for input(s): "TROPONINIHS" in the last 720 hours.  BNP Invalid input(s): "POCBNP" D-Dimer No results for input(s): "DDIMER" in the last 72 hours. Hemoglobin A1C No results for input(s): "HGBA1C" in the last 72 hours. Fasting Lipid Panel No results for  input(s): "CHOL", "HDL", "LDLCALC", "TRIG", "CHOLHDL", "LDLDIRECT" in the last 72 hours. Thyroid Function Tests No results for input(s): "TSH", "T4TOTAL", "T3FREE", "THYROIDAB" in the last 72 hours.  Invalid input(s): "FREET3" _____________  CARDIAC CATHETERIZATION  Result Date: 04/27/2023   1st Diag lesion is 80% stenosed.   Prox LAD to Mid LAD lesion is 100% stenosed.   A drug-eluting stent was successfully placed using a SYNERGY XD 2.50X24.   Post intervention, there is a 0% residual stenosis. 1.  Severe native three-vessel coronary artery disease with total occlusion of the RCA, total occlusion of the mid LAD, severe stenosis of the first diagonal, and severe stenosis of the mid circumflex 2.  Status post aortocoronary bypass surgery with continued patency of the LIMA to LAD and left radial graft to OM, total occlusion of the saphenous vein graft to diagonal/intermediate and right PDA 3.  Chronic occlusion of the RCA with left-to-right collaterals 4.  Successful PCI of the native diagonal with a 2.5 x 24 mm Synergy DES Recommendations: Same-day PCI protocol DAPT with aspirin and clopidogrel x 6 months   ECHOCARDIOGRAM COMPLETE  Result Date: 04/26/2023    ECHOCARDIOGRAM REPORT   Patient Name:   [name] [facility] Date of Exam: 04/26/2023 Medical Rec #:  [id number]             Height:       70.0 in Accession #:    [id number]            Weight:       186.2 lb Date of Birth:  Jan 27, 1961             BSA:          2.025 m Patient Age:    61 years              BP:           112/67 mmHg Patient Gender: M                     HR:           56 bpm. Exam Location:  [address] Procedure: 2D Echo, 3D Echo, Cardiac Doppler, Color Doppler and Strain Analysis Indications:    R07.9 Chest Pain  History:        Patient has no prior history of Echocardiogram examinations.                 CAD, Prior CABG, Arrythmias:first degree AV Block; Risk                 Factors:HLD.  Sonographer:    Clearence Ped RCS Referring  Phys: [id number] SREEDHAR R MADIREDDY IMPRESSIONS  1. Left ventricular ejection fraction, by estimation, is 55%. The left ventricle has normal function. The left ventricle demonstrates regional wall motion abnormalities, basal to mid inferior hypokinesis. There is mild asymmetric left ventricular hypertrophy of the basal-septal segment. Left ventricular diastolic parameters are consistent with Grade I diastolic dysfunction (impaired relaxation).  2. Right ventricular systolic function is mildly reduced. The right ventricular size is normal.  There is normal pulmonary artery systolic pressure. The estimated right ventricular systolic pressure is 13.6 mmHg.  3. The mitral valve is normal in structure. Trivial mitral valve regurgitation. No evidence of mitral stenosis.  4. The aortic valve is tricuspid. There is mild calcification of the aortic valve. Aortic valve regurgitation is trivial. No aortic stenosis is present.  5. The inferior vena cava is normal in size with greater than 50% respiratory variability, suggesting right atrial pressure of 3 mmHg. FINDINGS  Left Ventricle: Left ventricular ejection fraction, by estimation, is 55%. The left ventricle has normal function. The left ventricle demonstrates regional wall motion abnormalities. The left ventricular internal cavity size was normal in size. There is  mild asymmetric left ventricular hypertrophy of the basal-septal segment. Left ventricular diastolic parameters are consistent with Grade I diastolic dysfunction (impaired relaxation). Right Ventricle: The right ventricular size is normal. No increase in right ventricular wall thickness. Right ventricular systolic function is mildly reduced. There is normal pulmonary artery systolic pressure. The tricuspid regurgitant velocity is 1.63 m/s, and with an assumed right atrial pressure of 3 mmHg, the estimated right ventricular systolic pressure is 13.6 mmHg. Left Atrium: Left atrial size was normal in size. Right  Atrium: Right atrial size was normal in size. Pericardium: There is no evidence of pericardial effusion. Mitral Valve: The mitral valve is normal in structure. There is moderate calcification of the mitral valve leaflet(s). Mild mitral annular calcification. Trivial mitral valve regurgitation. No evidence of mitral valve stenosis. Tricuspid Valve: The tricuspid valve is normal in structure. Tricuspid valve regurgitation is trivial. Aortic Valve: The aortic valve is tricuspid. There is mild calcification of the aortic valve. Aortic valve regurgitation is trivial. No aortic stenosis is present. Pulmonic Valve: The pulmonic valve was normal in structure. Pulmonic valve regurgitation is trivial. Aorta: The aortic root is normal in size and structure. Venous: The inferior vena cava is normal in size with greater than 50% respiratory variability, suggesting right atrial pressure of 3 mmHg. IAS/Shunts: No atrial level shunt detected by color flow Doppler.  LEFT VENTRICLE PLAX 2D LVIDd:         3.80 cm   Diastology LVIDs:         2.30 cm   LV e' medial:    7.83 cm/s LV PW:         1.00 cm   LV E/e' medial:  10.4 LV IVS:        1.30 cm   LV e' lateral:   11.90 cm/s LVOT diam:     2.10 cm   LV E/e' lateral: 6.9 LV SV:         80 LV SV Index:   40        2D Longitudinal Strain LVOT Area:     3.46 cm  2D Strain GLS (A2C):   -21.0 %                          2D Strain GLS (A3C):   -16.9 %                          2D Strain GLS (A4C):   -16.2 %                          2D Strain GLS Avg:     -18.0 %  3D Volume EF:                          3D EF:        66 %                          LV EDV:       109 ml                          LV ESV:       37 ml                          LV SV:        72 ml RIGHT VENTRICLE RV Basal diam:  3.40 cm RV S prime:     12.00 cm/s TAPSE (M-mode): 2.1 cm RVSP:           13.6 mmHg LEFT ATRIUM             Index        RIGHT ATRIUM           Index LA diam:        4.10 cm 2.02 cm/m    RA Pressure: 3.00 mmHg LA Vol (A2C):   57.3 ml 28.30 ml/m  RA Area:     17.70 cm LA Vol (A4C):   35.6 ml 17.58 ml/m  RA Volume:   46.30 ml  22.87 ml/m LA Biplane Vol: 45.3 ml 22.37 ml/m  AORTIC VALVE LVOT Vmax:   91.00 cm/s LVOT Vmean:  65.300 cm/s LVOT VTI:    0.231 m  AORTA Ao Root diam: 3.10 cm Ao Asc diam:  3.00 cm MITRAL VALVE               TRICUSPID VALVE MV Area (PHT):             TR Peak grad:   10.6 mmHg MV Decel Time:             TR Vmax:        163.00 cm/s MV E velocity: 81.80 cm/s  Estimated RAP:  3.00 mmHg MV A velocity: 84.90 cm/s  RVSP:           13.6 mmHg MV E/A ratio:  0.96                            SHUNTS                            Systemic VTI:  0.23 m                            Systemic Diam: 2.10 cm Dalton McleanMD Electronically signed by Wilfred Lacy Signature Date/Time: 04/26/2023/4:40:02 PM    Final    MYOCARDIAL PERFUSION IMAGING  Result Date: 04/20/2023   Findings are consistent with moderate ischemia involving basal, mid and apical portion of the antero - lateral wall. The study is intermediate risk.   No ST deviation was noted.   Left ventricular function is normal. Nuclear stress EF: 66%. The left ventricular ejection fraction is hyperdynamic (>65%). End diastolic cavity size is normal.   Prior study not available for comparison.    Disposition  Pt is being discharged home today in good condition.  Follow-up Plans & Appointments     Follow-up Information     Madireddy, Marlyn Corporal, MD Follow up on 05/11/2023.   Specialty: Cardiology Why: at 1pm for your follow up appt with cardiology Contact information: [address] [place] [address] [phone]                Discharge Instructions     AMB Referral to Cardiac Rehabilitation - Phase II   Complete by: As directed    Diagnosis: Coronary Stents   After initial evaluation and assessments completed: Virtual Based Care may be provided alone or in conjunction with Phase 2 Cardiac Rehab based on  patient barriers.: Yes   Intensive Cardiac Rehabilitation (ICR) [facility] location only OR Traditional Cardiac Rehabilitation (TCR) *If criteria for ICR are not met will enroll in TCR [facility] only): Yes        Discharge Medications   Allergies as of 04/27/2023       Reactions   Codeine Sulfate    migraine headache   Iodine Hives   Oxycodone-acetaminophen    headache        Medication List     STOP taking these medications    isosorbide mononitrate 30 MG 24 hr tablet Commonly known as: IMDUR       TAKE these medications    acetaminophen 500 MG tablet Commonly known as: TYLENOL Take 1,000 mg by mouth every 6 (six) hours as needed for moderate pain.   aspirin EC 81 MG tablet Take 81 mg by mouth daily. Swallow whole.   CENTRUM SILVER PO Take 1 tablet by mouth daily.   cholecalciferol 25 MCG (1000 UNIT) tablet Commonly known as: VITAMIN D3 Take 1,000 Units by mouth daily.   clopidogrel 75 MG tablet Commonly known as: Plavix Take 1 tablet (75 mg total) by mouth daily.   loratadine 10 MG tablet Commonly known as: CLARITIN Take 10 mg by mouth daily as needed for allergies.   metoprolol tartrate 25 MG tablet Commonly known as: LOPRESSOR Take 1 tablet (25 mg total) by mouth 2 (two) times daily.   nitroGLYCERIN 0.4 MG SL tablet Commonly known as: NITROSTAT Place 1 tablet (0.4 mg total) under the tongue every 5 (five) minutes as needed for chest pain (3 doses max).   predniSONE 50 MG tablet Commonly known as: DELTASONE As directed   Repatha SureClick 140 MG/ML Soaj Generic drug: Evolocumab Inject 140 mg into the skin every 14 (fourteen) days.   rosuvastatin 40 MG tablet Commonly known as: CRESTOR Take 40 mg by mouth daily.   Vitamin C 250 MG Chew Chew 750 mg by mouth daily.          Allergies Allergies  Allergen Reactions   Codeine Sulfate     migraine headache   Iodine Hives   Oxycodone-Acetaminophen     headache    Outstanding Labs/Studies    N/a   Duration of Discharge Encounter   Greater than 30 minutes including physician time.  Ramond Dial, PA 04/27/2023, 6:48 PM

## 2023-04-28 ENCOUNTER — Telehealth (HOSPITAL_COMMUNITY): Payer: Self-pay

## 2023-04-28 NOTE — Telephone Encounter (Signed)
Spoke with pt post stent procedure. Gave education on risk factors, exercise guidelines, angina, antiplatelets, restrictions, nutrition, site care, NTG use, and CRP2. Referral to be placed to [facility]. Pt is not interested in CRP2 at all at this time, however highly encouraged to go. Pt voiced understanding, all questions answered.   Jonna Coup, MS, ACSM-CEP 04/28/2023 11:23 AM

## 2023-04-30 ENCOUNTER — Encounter (HOSPITAL_COMMUNITY): Payer: Self-pay | Admitting: Cardiovascular Disease

## 2023-05-01 ENCOUNTER — Telehealth (HOSPITAL_COMMUNITY): Payer: Self-pay

## 2023-05-01 NOTE — Telephone Encounter (Signed)
Per phase I cardiac rehab, fax referral to [facility].

## 2023-05-09 ENCOUNTER — Other Ambulatory Visit: Payer: Self-pay

## 2023-05-11 ENCOUNTER — Ambulatory Visit: Payer: BC Managed Care – PPO

## 2023-05-11 VITALS — BP 108/70 | HR 64 | Ht 70.0 in | Wt 184.2 lb

## 2023-05-11 DIAGNOSIS — E782 Mixed hyperlipidemia: Secondary | ICD-10-CM

## 2023-05-11 DIAGNOSIS — I25118 Atherosclerotic heart disease of native coronary artery with other forms of angina pectoris: Secondary | ICD-10-CM | POA: Diagnosis not present

## 2023-05-11 MED ORDER — LOSARTAN POTASSIUM 25 MG PO TABS: 12.5000 mg | ORAL_TABLET | Freq: Every day | ORAL | 3 refills | Status: DC

## 2023-05-11 NOTE — Assessment & Plan Note (Addendum)
Doing well post. Continue aspirin and Plavix. Continue Plavix uninterrupted for at least 12 months. Continue aspirin 81 mg once daily.  Continue metoprolol titrate 25 mg twice daily. Given there is mild hypokinesis of inferior segments on recent echocardiogram 04-26-2023, would recommend starting a low-dose losartan 12.5 mg daily for cardiomyopathy [low-dose considering his soft blood pressures].Marland Kitchen

## 2023-05-11 NOTE — Assessment & Plan Note (Signed)
Good control. Continue Repatha and rosuvastatin. Zetia was discontinued. Last lipid panel 03/08/2023 LDL was 24, HDL 50, total cholesterol 89, triglycerides 69.

## 2023-05-11 NOTE — Patient Instructions (Signed)
Medication Instructions:  Your physician has recommended you make the following change in your medication:   Start Losartan 12.5 mg daily.  *If you need a refill on your cardiac medications before your next appointment, please call your pharmacy*   Lab Work: None ordered If you have labs (blood work) drawn today and your tests are completely normal, you will receive your results only by: MyChart Message (if you have MyChart) OR A paper copy in the mail If you have any lab test that is abnormal or we need to change your treatment, we will call you to review the results.   Testing/Procedures: None ordered   Follow-Up: At [facility], you and your health needs are our priority.  As part of our continuing mission to provide you with exceptional heart care, we have created designated Provider Care Teams.  These Care Teams include your primary Cardiologist (physician) and Advanced Practice Providers (APPs -  Physician Assistants and Nurse Practitioners) who all work together to provide you with the care you need, when you need it.  We recommend signing up for the patient portal called "MyChart".  Sign up information is provided on this After Visit Summary.  MyChart is used to connect with patients for Virtual Visits (Telemedicine).  Patients are able to view lab/test results, encounter notes, upcoming appointments, etc.  Non-urgent messages can be sent to your provider as well.   To learn more about what you can do with MyChart, go to [email or web address].    Your next appointment:   1 month(s)  The format for your next appointment:   In Person  Provider:   Huntley Dec, MD    Other Instructions none  Important Information About Sugar

## 2023-05-11 NOTE — Progress Notes (Signed)
Cardiology Consultation:    Date:  05/11/2023   ID:  [name], DOB March 02, 1961, MRN [id number]  PCP:  [name], MD  Cardiologist:  Marlyn Corporal Leeasia Secrist, MD   Referring MD: [name], MD   No chief complaint on file. F/u post cath   ASSESSMENT AND PLAN:   [name] 62 year old male patient with history of CAD s/p CABG in 2009, hyperlipidemia, prediabetes, Lexiscan stress test abnormal August 27, followed by coronary angiogram 9 [place]/2024 and underwent PCI of diagonal.   Problem List Items Addressed This Visit     Hyperlipidemia    Good control. Continue Repatha and rosuvastatin. Zetia was discontinued. Last lipid panel 03/08/2023 LDL was 24, HDL 50, total cholesterol 89, triglycerides 69.       Relevant Medications   losartan (COZAAR) 25 MG tablet   CAD s/p 5 vessel CABG in 2009; Lexiscan stress MPI moderate ischemia basal to apical anterolateral segments August 2024; with dyspnea on exertion; s/p cath and PCI of diagonal 9 /01/2023 - - Primary    Doing well post. Continue aspirin and Plavix. Continue Plavix uninterrupted for at least 12 months. Continue aspirin 81 mg once daily.  Continue metoprolol titrate 25 mg twice daily. Given there is mild hypokinesis of inferior segments on recent echocardiogram 04-26-2023, would recommend starting a low-dose losartan 12.5 mg daily for cardiomyopathy [low-dose considering his soft blood pressures]..      Relevant Medications   losartan (COZAAR) 25 MG tablet   Return to clinic in 1 month to assess interval symptoms on losartan with blood pressure control and overall functional status. Advised to increase physical activity incrementally as tolerated.   History of Present Illness:    [name] is a 62 y.o. male who is being seen today for t follow-up after recent PCI in the setting of abnormal stress test at the request of [name], MD.  Has history of CAD s/p CABG in 2009, hyperlipidemia,  with intermittent chest pain episodes Lexiscan stress test August 27 showed moderate ischemia.  Scheduled for coronary angiogram that was done on 04/27/2023, showed patent LIMA-LAD, SVG-OM1, occluded SVG to RCA and SVG to diagonal.  Successfully underwent PCI of diagonal.  Was right femoral artery access.  Overall mentions to be doing well.  Energy levels are slowly improving. No right groin Access site issues.  Denies any chest pain, shortness of breath, orthopnea paroxysmal nocturnal dyspnea, pedal edema. No lightheadedness, palpitations, dizziness.    Past Medical History:  Diagnosis Date   Abnormal nuclear stress test 04/25/2023   CAD (coronary artery disease) 07/01/2013   CAD in native artery    CHEST PAIN 02/17/2008   Qualifier: Diagnosis of   By: Cato Mulligan MD, Bruce         CORONARY ARTERY BYPASS GRAFT, HX OF    First degree AV block    Hyperlipidemia 02/15/2007   Qualifier: Diagnosis of   By: Cato Mulligan MD, Bruce         TACHYCARDIA    UNS ADVRS EFF UNS RX MEDICINAL&BIOLOGICAL SBSTNC     Past Surgical History:  Procedure Laterality Date   CORONARY ARTERY BYPASS GRAFT     CORONARY PRESSURE/FFR STUDY N/A 04/27/2023   Procedure: CORONARY PRESSURE/FFR STUDY;  Surgeon: Tonny Bollman, MD;  [facility] INVASIVE CV LAB;  Service: Cardiovascular;  Laterality: N/A;   CORONARY STENT INTERVENTION N/A 04/27/2023   Procedure: CORONARY STENT INTERVENTION;  Surgeon: Tonny Bollman, MD;  Location[facility] INVASIVE CV LAB;  Service: Cardiovascular;  Laterality: N/A;   HAND SURGERY     LEFT HEART CATH AND CORS/GRAFTS ANGIOGRAPHY N/A 04/27/2023   Procedure: LEFT HEART CATH AND CORS/GRAFTS ANGIOGRAPHY;  Surgeon: Tonny Bollman, MD;  Location: [facility] INVASIVE CV LAB;  Service: Cardiovascular;  Laterality: N/A;   VASECTOMY     WISDOM TOOTH EXTRACTION      Current Medications: Current Meds  Medication Sig   acetaminophen (TYLENOL) 500 MG tablet Take 1,000 mg by mouth every 6 (six) hours as needed for moderate  pain.   Ascorbic Acid (VITAMIN C) 250 MG CHEW Chew 750 mg by mouth daily.   aspirin EC 81 MG tablet Take 81 mg by mouth daily. Swallow whole.   cholecalciferol (VITAMIN D3) 25 MCG (1000 UNIT) tablet Take 1,000 Units by mouth daily.   clopidogrel (PLAVIX) 75 MG tablet Take 1 tablet (75 mg total) by mouth daily.   Evolocumab (REPATHA SURECLICK) 140 MG/ML SOAJ Inject 140 mg into the skin every 14 (fourteen) days.   loratadine (CLARITIN) 10 MG tablet Take 10 mg by mouth daily as needed for allergies.   losartan (COZAAR) 25 MG tablet Take 0.5 tablets (12.5 mg total) by mouth daily.   metoprolol tartrate (LOPRESSOR) 25 MG tablet Take 1 tablet (25 mg total) by mouth 2 (two) times daily.   Multiple Vitamins-Minerals (CENTRUM SILVER PO) Take 1 tablet by mouth daily.     nitroGLYCERIN (NITROSTAT) 0.4 MG SL tablet Place 1 tablet (0.4 mg total) under the tongue every 5 (five) minutes as needed for chest pain (3 doses max).   rosuvastatin (CRESTOR) 40 MG tablet Take 40 mg by mouth daily.       Allergies:   Codeine sulfate, Iodine, and Oxycodone-acetaminophen   Social History   Socioeconomic History   Marital status: Married    Spouse name: Not on file   Number of children: Not on file   Years of education: Not on file   Highest education level: Not on file  Occupational History   Not on file  Tobacco Use   Smoking status: Never   Smokeless tobacco: Never  Vaping Use   Vaping status: Never Used  Substance and Sexual Activity   Alcohol use: Never   Drug use: Never   Sexual activity: Not on file  Other Topics Concern   Not on file  Social History Narrative   Not on file   Social Determinants of Health   Financial Resource Strain: Not on file  Food Insecurity: Not on file  Transportation Needs: Not on file  Physical Activity: Not on file  Stress: Not on file  Social Connections: Not on file     Family History: The patient's family history includes Coronary artery disease in his  father; Ovarian cancer in his mother; Valvular heart disease in his mother. ROS:   Please see the history of present illness.    All 14 point review of systems negative except as described per history of present illness.  EKGs/Labs/Other Studies Reviewed:    The following studies were reviewed today:   EKG:       Recent Labs: 04/25/2023: BUN 14; Creatinine, Ser 0.94; Hemoglobin 17.1; Platelets 289; Potassium 4.2; Sodium 141  Recent Lipid Panel    Component Value Date/Time   CHOL 176 07/01/2008 0935   TRIG 136 07/01/2008 0935   HDL 32.5 (L) 07/01/2008 0935   CHOLHDL 5.4 CALC 07/01/2008 0935   VLDL 27 07/01/2008 0935   LDLCALC 116 (H) 07/01/2008 0935   LDLDIRECT 149.9 02/03/2008 1006  Physical Exam:    VS:  BP 108/70   Pulse 64   Ht 5\' 10"  (1.778 m)   Wt 184 lb 3.2 oz (83.6 kg)   SpO2 97%   BMI 26.43 kg/m     Wt Readings from Last 3 Encounters:  05/11/23 184 lb 3.2 oz (83.6 kg)  04/27/23 183 lb (83 kg)  04/25/23 186 lb 3.2 oz (84.5 kg)     GENERAL:  Well nourished, well developed in no acute distress NECK: No JVD; No carotid bruits CARDIAC: RRR, S1 and S2 present, no murmurs, no rubs, no gallops CHEST:  Clear to auscultation without rales, wheezing or rhonchi  Extremities: No pitting pedal edema. Pulses bilaterally symmetric with radial 2+ and dorsalis pedis 2+ NEUROLOGIC:  Alert and oriented x 3  Medication Adjustments/Labs and Tests Ordered: Current medicines are reviewed at length with the patient today.  Concerns regarding medicines are outlined above.  No orders of the defined types were placed in this encounter.  Meds ordered this encounter  Medications   losartan (COZAAR) 25 MG tablet    Sig: Take 0.5 tablets (12.5 mg total) by mouth daily.    Dispense:  45 tablet    Refill:  3    Signed, Kiyonna Tortorelli reddy Donzel Romack, MD, MPH, [facility]. 05/11/2023 1:52 PM    [facility]

## 2023-05-21 ENCOUNTER — Other Ambulatory Visit: Payer: Self-pay

## 2023-05-21 MED ORDER — CLOPIDOGREL BISULFATE 75 MG PO TABS: 75.0000 mg | ORAL_TABLET | Freq: Every day | ORAL | 3 refills | Status: DC

## 2023-05-21 NOTE — Telephone Encounter (Signed)
Plavix sent to [facility] 75mg  #90 ref x 3 per pt request

## 2023-05-25 ENCOUNTER — Ambulatory Visit: Payer: BC Managed Care – PPO | Admitting: Cardiology

## 2023-06-12 ENCOUNTER — Ambulatory Visit: Payer: BC Managed Care – PPO

## 2023-06-13 ENCOUNTER — Ambulatory Visit: Payer: BC Managed Care – PPO

## 2023-06-13 VITALS — BP 116/78 | HR 68 | Ht 70.0 in | Wt 186.8 lb

## 2023-06-13 DIAGNOSIS — I25118 Atherosclerotic heart disease of native coronary artery with other forms of angina pectoris: Secondary | ICD-10-CM | POA: Diagnosis not present

## 2023-06-13 DIAGNOSIS — E782 Mixed hyperlipidemia: Secondary | ICD-10-CM | POA: Diagnosis not present

## 2023-06-13 NOTE — Patient Instructions (Signed)
Medication Instructions:  Your physician recommends that you continue on your current medications as directed. Please refer to the Current Medication list given to you today.  *If you need a refill on your cardiac medications before your next appointment, please call your pharmacy*   Lab Work: Your physician recommends that you have a BMP today in the office.  If you have labs (blood work) drawn today and your tests are completely normal, you will receive your results only by: MyChart Message (if you have MyChart) OR A paper copy in the mail If you have any lab test that is abnormal or we need to change your treatment, we will call you to review the results.   Testing/Procedures: None ordered   Follow-Up: At [facility], you and your health needs are our priority.  As part of our continuing mission to provide you with exceptional heart care, we have created designated Provider Care Teams.  These Care Teams include your primary Cardiologist (physician) and Advanced Practice Providers (APPs -  Physician Assistants and Nurse Practitioners) who all work together to provide you with the care you need, when you need it.  We recommend signing up for the patient portal called "MyChart".  Sign up information is provided on this After Visit Summary.  MyChart is used to connect with patients for Virtual Visits (Telemedicine).  Patients are able to view lab/test results, encounter notes, upcoming appointments, etc.  Non-urgent messages can be sent to your provider as well.   To learn more about what you can do with MyChart, go to [email or web address].    Your next appointment:   12 month(s)  The format for your next appointment:   In Person  Provider:   Huntley Dec, MD    Other Instructions none  Important Information About Sugar

## 2023-06-13 NOTE — Assessment & Plan Note (Signed)
Last lipid panel July 2024 LDL was 24, HDL 50. Continue with current regimen Repatha and rosuvastatin.

## 2023-06-13 NOTE — Assessment & Plan Note (Signed)
CCS class I. Asymptomatic.  Continue with dual antiplatelet regimen aspirin and Plavix 81 mg and 75 mg respectively. Continue lipid-lowering therapy with rosuvastatin, Repatha.  Continue metoprolol tartrate 25 mg twice daily and losartan 12.5 mg once a day.  Will follow-up basic metabolic panel today to ensure renal function and electrolytes are stable now that he is on losartan consistently.

## 2023-06-13 NOTE — Progress Notes (Signed)
Cardiology Consultation:    Date:  06/13/2023   ID:  [name], DOB Apr 26, 1961, MRN [id number]  PCP:  [name], MD  Cardiologist:  Marlyn Corporal Luwanda Starr, MD   Referring MD: [name], MD   No chief complaint on file.    ASSESSMENT AND PLAN:   [name] 62 year old male patient with history of CAD s/p CABG in 2009, abnormal Lexiscan stress test August 2024 followed by PCI of diagonal 04/27/2023, preserved EF with mild inferior segment hypokinesis on echocardiogram 04-26-2023 hyperlipidemia, prediabetes, here for follow-up visit  Problem List Items Addressed This Visit     Hyperlipidemia    Last lipid panel July 2024 LDL was 24, HDL 50. Continue with current regimen Repatha and rosuvastatin.      CAD s/p 5 vessel CABG in 2009; Lexiscan stress MPI moderate ischemia basal to apical anterolateral segments August 2024; with dyspnea on exertion; s/p cath and PCI of diagonal 9 /01/2023 - - Primary    CCS class I. Asymptomatic.  Continue with dual antiplatelet regimen aspirin and Plavix 81 mg and 75 mg respectively. Continue lipid-lowering therapy with rosuvastatin, Repatha.  Continue metoprolol tartrate 25 mg twice daily and losartan 12.5 mg once a day.  Will follow-up basic metabolic panel today to ensure renal function and electrolytes are stable now that he is on losartan consistently.        Relevant Orders   Basic metabolic panel   Return to clinic in 1 year or as needed.   History of Present Illness:    [name] is a 62 y.o. male who is being seen today for follow-up visit for evaluation of CAD, mild cardiomyopathy with preserved EF at the request of [name], MD. His last office visit with me was 05-11-2023   history of CAD s/p CABG in 2009, abnormal Lexiscan stress test August 2024 followed by PCI of diagonal 04/27/2023, preserved EF with mild inferior segment hypokinesis on echocardiogram 04-26-2023 hyperlipidemia, prediabetes, here for  follow-up visit.  We started losartan for further optimization of his medication regimen.  Mentions has been doing well.  Denies any symptoms.  Regularly exercising including weight training and cardio training up to 30 minutes a day. No cardiac symptoms at this time.  Tolerating his medications without any side effects.  No blood in urine or stools.  Last blood work from 04-25-2023 noted BUN 14, creatinine 0.94, EGFR 92, sodium 141, potassium 4.2  Past Medical History:  Diagnosis Date   Abnormal nuclear stress test 04/25/2023   CAD (coronary artery disease) 07/01/2013   CAD in native artery    CHEST PAIN 02/17/2008   Qualifier: Diagnosis of   By: Cato Mulligan MD, Bruce         CORONARY ARTERY BYPASS GRAFT, HX OF    First degree AV block    Hyperlipidemia 02/15/2007   Qualifier: Diagnosis of   By: Cato Mulligan MD, Bruce         TACHYCARDIA    UNS ADVRS EFF UNS RX MEDICINAL&BIOLOGICAL SBSTNC     Past Surgical History:  Procedure Laterality Date   CORONARY ARTERY BYPASS GRAFT     CORONARY PRESSURE/FFR STUDY N/A 04/27/2023   Procedure: CORONARY PRESSURE/FFR STUDY;  Surgeon: Tonny Bollman, MD;  Location: [facility] INVASIVE CV LAB;  Service: Cardiovascular;  Laterality: N/A;   CORONARY STENT INTERVENTION N/A 04/27/2023   Procedure: CORONARY STENT INTERVENTION;  Surgeon: Tonny Bollman, MD;  Location: [facility] INVASIVE CV LAB;  Service: Cardiovascular;  Laterality: N/A;   HAND  SURGERY     LEFT HEART CATH AND CORS/GRAFTS ANGIOGRAPHY N/A 04/27/2023   Procedure: LEFT HEART CATH AND CORS/GRAFTS ANGIOGRAPHY;  Surgeon: Tonny Bollman, MD;  Location: [facility] INVASIVE CV LAB;  Service: Cardiovascular;  Laterality: N/A;   VASECTOMY     WISDOM TOOTH EXTRACTION      Current Medications: Current Meds  Medication Sig   acetaminophen (TYLENOL) 500 MG tablet Take 1,000 mg by mouth every 6 (six) hours as needed for moderate pain.   Ascorbic Acid (VITAMIN C) 250 MG CHEW Chew 750 mg by mouth daily.   aspirin EC 81 MG tablet Take  81 mg by mouth daily. Swallow whole.   cholecalciferol (VITAMIN D3) 25 MCG (1000 UNIT) tablet Take 1,000 Units by mouth daily.   clopidogrel (PLAVIX) 75 MG tablet Take 1 tablet (75 mg total) by mouth daily.   Evolocumab (REPATHA SURECLICK) 140 MG/ML SOAJ Inject 140 mg into the skin every 14 (fourteen) days.   loratadine (CLARITIN) 10 MG tablet Take 10 mg by mouth daily as needed for allergies.   losartan (COZAAR) 25 MG tablet Take 0.5 tablets (12.5 mg total) by mouth daily.   metoprolol tartrate (LOPRESSOR) 25 MG tablet Take 1 tablet (25 mg total) by mouth 2 (two) times daily.   Multiple Vitamins-Minerals (CENTRUM SILVER PO) Take 1 tablet by mouth daily.     nitroGLYCERIN (NITROSTAT) 0.4 MG SL tablet Place 1 tablet (0.4 mg total) under the tongue every 5 (five) minutes as needed for chest pain (3 doses max).   rosuvastatin (CRESTOR) 40 MG tablet Take 40 mg by mouth daily.       Allergies:   Codeine sulfate, Iodine, and Oxycodone-acetaminophen   Social History   Socioeconomic History   Marital status: Married    Spouse name: Not on file   Number of children: Not on file   Years of education: Not on file   Highest education level: Not on file  Occupational History   Not on file  Tobacco Use   Smoking status: Never   Smokeless tobacco: Never  Vaping Use   Vaping status: Never Used  Substance and Sexual Activity   Alcohol use: Never   Drug use: Never   Sexual activity: Not on file  Other Topics Concern   Not on file  Social History Narrative   Not on file   Social Determinants of Health   Financial Resource Strain: Not on file  Food Insecurity: Not on file  Transportation Needs: Not on file  Physical Activity: Not on file  Stress: Not on file  Social Connections: Not on file     Family History: The patient's family history includes Coronary artery disease in his father; Ovarian cancer in his mother; Valvular heart disease in his mother. ROS:   Please see the history of  present illness.    All 14 point review of systems negative except as described per history of present illness.  EKGs/Labs/Other Studies Reviewed:    The following studies were reviewed today:   EKG:       Recent Labs: 04/25/2023: BUN 14; Creatinine, Ser 0.94; Hemoglobin 17.1; Platelets 289; Potassium 4.2; Sodium 141  Recent Lipid Panel    Component Value Date/Time   CHOL 176 07/01/2008 0935   TRIG 136 07/01/2008 0935   HDL 32.5 (L) 07/01/2008 0935   CHOLHDL 5.4 CALC 07/01/2008 0935   VLDL 27 07/01/2008 0935   LDLCALC 116 (H) 07/01/2008 0935   LDLDIRECT 149.9 02/03/2008 1006    Physical Exam:  VS:  BP 116/78   Pulse 68   Ht 5\' 10"  (1.778 m)   Wt 186 lb 12.8 oz (84.7 kg)   SpO2 97%   BMI 26.80 kg/m     Wt Readings from Last 3 Encounters:  06/13/23 186 lb 12.8 oz (84.7 kg)  05/11/23 184 lb 3.2 oz (83.6 kg)  04/27/23 183 lb (83 kg)     GENERAL:  Well nourished, well developed in no acute distress NECK: No JVD; No carotid bruits CARDIAC: RRR, S1 and S2 present, no murmurs, no rubs, no gallops CHEST:  Clear to auscultation without rales, wheezing or rhonchi  Extremities: No pitting pedal edema. Pulses bilaterally symmetric with radial 2+ and dorsalis pedis 2+ NEUROLOGIC:  Alert and oriented x 3  Medication Adjustments/Labs and Tests Ordered: Current medicines are reviewed at length with the patient today.  Concerns regarding medicines are outlined above.  Orders Placed This Encounter  Procedures   Basic metabolic panel   No orders of the defined types were placed in this encounter.   Signed, Cecille Amsterdam, MD, MPH, [facility]. 06/13/2023 1:14 PM    [facility]

## 2023-06-14 LAB — BASIC METABOLIC PANEL
BUN/Creatinine Ratio: 16 (ref 10–24)
BUN: 15 mg/dL (ref 8–27)
CO2: 24 mmol/L (ref 20–29)
Calcium: 9.3 mg/dL (ref 8.6–10.2)
Chloride: 101 mmol/L (ref 96–106)
Creatinine, Ser: 0.95 mg/dL (ref 0.76–1.27)
Glucose: 86 mg/dL (ref 70–99)
Potassium: 4 mmol/L (ref 3.5–5.2)
Sodium: 141 mmol/L (ref 134–144)
eGFR: 90 mL/min/{1.73_m2} (ref >59)

## 2023-09-15 ENCOUNTER — Other Ambulatory Visit: Payer: Self-pay | Admitting: Cardiovascular Disease

## 2023-09-17 NOTE — Telephone Encounter (Signed)
Rx refill sent to pharmacy.

## 2023-10-02 ENCOUNTER — Other Ambulatory Visit: Payer: Self-pay | Admitting: Cardiovascular Disease

## 2023-12-10 ENCOUNTER — Telehealth: Payer: Self-pay | Admitting: Pharmacy Technician

## 2023-12-10 NOTE — Telephone Encounter (Signed)
 Pharmacy Patient Advocate Encounter   Received notification from CoverMyMeds that prior authorization for repatha  is required/requested.   Insurance verification completed.   The patient is insured through [facility] .   Per test claim: PA required; PA submitted to above mentioned insurance via CoverMyMeds Key/confirmation #/EOC [id number] Status is pending

## 2023-12-10 NOTE — Telephone Encounter (Signed)
 Pharmacy Patient Advocate Encounter  Received notification from [employer] that Prior Authorization for repatha  has been APPROVED from 12/10/2023 to 12/09/2024. Spoke to pharmacy to process.Copay is $30.00.    PA #/Case ID/Reference #: [id number]

## 2024-04-28 ENCOUNTER — Other Ambulatory Visit: Payer: Self-pay

## 2024-04-28 MED ORDER — LOSARTAN POTASSIUM 25 MG PO TABS: 12.5000 mg | ORAL_TABLET | Freq: Every day | ORAL | 0 refills | Status: DC

## 2024-05-20 ENCOUNTER — Telehealth: Payer: Self-pay | Admitting: *Deleted

## 2024-05-20 MED ORDER — CLOPIDOGREL BISULFATE 75 MG PO TABS: 75.0000 mg | ORAL_TABLET | Freq: Every day | ORAL | 0 refills | Status: DC

## 2024-05-20 NOTE — Telephone Encounter (Signed)
 Rx refill sent to pharmacy.

## 2024-06-24 ENCOUNTER — Other Ambulatory Visit: Payer: Self-pay

## 2024-06-24 MED ORDER — METOPROLOL TARTRATE 25 MG PO TABS: 25.0000 mg | ORAL_TABLET | Freq: Two times a day (BID) | ORAL | 0 refills | Status: DC

## 2024-07-01 ENCOUNTER — Ambulatory Visit: Payer: Self-pay

## 2024-07-01 VITALS — BP 126/82 | HR 73 | Ht 70.0 in | Wt 192.4 lb

## 2024-07-01 DIAGNOSIS — E782 Mixed hyperlipidemia: Secondary | ICD-10-CM

## 2024-07-01 DIAGNOSIS — I25118 Atherosclerotic heart disease of native coronary artery with other forms of angina pectoris: Secondary | ICD-10-CM

## 2024-07-01 NOTE — Assessment & Plan Note (Signed)
 Well-controlled on recent blood work from 06/26/2024 with LDL 38, HDL 49, triglycerides 103 and total cholesterol 106.  Continue Repatha  and rosuvastatin.

## 2024-07-01 NOTE — Patient Instructions (Signed)

## 2024-07-01 NOTE — Assessment & Plan Note (Signed)
 Remains asymptomatic. Excellent functional status. Continue with dual antiplatelet therapy using aspirin and Plavix  uninterrupted for another 6 months.  After that he can continue either aspirin 81 mg once daily or Plavix  75 mg once daily monotherapy.  Continue low-dose metoprolol  tartrate 25 mg twice daily. Remains on low-dose losartan  12.5 mg once daily

## 2024-07-01 NOTE — Progress Notes (Signed)
 Cardiology Consultation:    Date:  07/01/2024   ID:  [name], DOB [birth year], MRN [id number]  PCP:  Derrick Hun, MD  Cardiologist:  Derrick SAUNDERS Tanaia Hawkey, MD   Referring MD: Derrick Hun, MD   No chief complaint on file.    ASSESSMENT AND PLAN:   [name] 63 year old male with history of CAD s/p CABG in 2009, abnormal Lexiscan  stress test August 2024 followed by PCI of diagonal 04/27/2023, preserved EF with mild inferior segment hypokinesis on echocardiogram 04-26-2023 hyperlipidemia, prediabetes   here for routine follow-up visit.  Problem List Items Addressed This Visit     Hyperlipidemia   Well-controlled on recent blood work from 06/26/2024 with LDL 38, HDL 49, triglycerides 103 and total cholesterol 106.  Continue Repatha  and rosuvastatin.       Relevant Orders   EKG 12-Lead (Completed)   CAD s/p 5 vessel CABG in 2009; Lexiscan  stress MPI moderate ischemia basal to apical anterolateral segments August 2024; with dyspnea on exertion; s/p cath and PCI of diagonal 9 /01/2023 - - Primary   Remains asymptomatic. Excellent functional status. Continue with dual antiplatelet therapy using aspirin and Plavix  uninterrupted for another 6 months.  After that he can continue either aspirin 81 mg once daily or Plavix  75 mg once daily monotherapy.  Continue low-dose metoprolol  tartrate 25 mg twice daily. Remains on low-dose losartan  12.5 mg once daily       Relevant Orders   EKG 12-Lead (Completed)  Return to clinic for follow-up tentatively in 1 year or on as-needed basis    History of Present Illness:    [name] is a 63 y.o. male who is being seen today for follow-up visit. PCP is Derrick Hun, MD. Last visit with me in the office was 06/13/2023.  Pleasant man here for visit by himself.  Continues to work as a runner, broadcasting/film/video.  Plans on retirement this academic year.  history of CAD s/p CABG in 2009, abnormal Lexiscan  stress test August 2024 followed  by PCI of diagonal 04/27/2023, preserved EF with mild inferior segment hypokinesis on echocardiogram 04-26-2023 hyperlipidemia, prediabetes   Mentions has been doing well physically able to exercise couple times a week at the gym.  In summary he was exercising 4 times a week.  Able to keep up with his activities and around the house without any limitations.  Working at school at times when he is stressed out with work he tends to feel a sensation of having to catch his breath.  Relieves with mindful breathing.  No palpitations, lightheadedness, dizziness, syncopal episodes. No blood in urine or stools.  Good compliance with his medications.   EKG with sinus rhythm heart rate 73/min, PR interval 192 ms, QRS duration 88 ms, no ischemic changes.  Left posterior fascicular block appearance.   Blood work from 06/26/2024 noted total cholesterol 106, triglycerides 103, HDL 49, LDL 38.  Well-controlled. Normal transaminases and alkaline phosphatase BUN 15, creatinine 1.07, eGFR 78 Sodium 136 and potassium 4. Hemoglobin A1c 5.7.  Past Medical History:  Diagnosis Date   Abnormal nuclear stress test 04/25/2023   CAD (coronary artery disease) 07/01/2013   CAD in native artery    CHEST PAIN 02/17/2008   Qualifier: Diagnosis of   By: Tammie MD, Bruce         CORONARY ARTERY BYPASS GRAFT, HX OF    First degree AV block    Hyperlipidemia 02/15/2007   Qualifier: Diagnosis of   By: Tammie MD, Wolm  TACHYCARDIA    UNS ADVRS EFF UNS RX MEDICINAL&BIOLOGICAL SBSTNC     Past Surgical History:  Procedure Laterality Date   CORONARY ARTERY BYPASS GRAFT     CORONARY PRESSURE/FFR STUDY N/A 04/27/2023   Procedure: CORONARY PRESSURE/FFR STUDY;  Surgeon: Wonda Sharper, MD;  Location[facility] INVASIVE CV LAB;  Service: Cardiovascular;  Laterality: N/A;   CORONARY STENT INTERVENTION N/A 04/27/2023   Procedure: CORONARY STENT INTERVENTION;  Surgeon: Wonda Sharper, MD;  Location: [facility] INVASIVE CV LAB;  Service:  Cardiovascular;  Laterality: N/A;   HAND SURGERY     LEFT HEART CATH AND CORS/GRAFTS ANGIOGRAPHY N/A 04/27/2023   Procedure: LEFT HEART CATH AND CORS/GRAFTS ANGIOGRAPHY;  Surgeon: Wonda Sharper, MD;  Location: [facility] INVASIVE CV LAB;  Service: Cardiovascular;  Laterality: N/A;   VASECTOMY     WISDOM TOOTH EXTRACTION      Current Medications: Current Meds  Medication Sig   acetaminophen (TYLENOL) 500 MG tablet Take 1,000 mg by mouth every 6 (six) hours as needed for moderate pain.   Ascorbic Acid (VITAMIN C) 250 MG CHEW Chew 750 mg by mouth daily.   aspirin EC 81 MG tablet Take 81 mg by mouth daily. Swallow whole.   cholecalciferol (VITAMIN D3) 25 MCG (1000 UNIT) tablet Take 1,000 Units by mouth daily.   clopidogrel  (PLAVIX ) 75 MG tablet Take 1 tablet (75 mg total) by mouth daily.   loratadine (CLARITIN) 10 MG tablet Take 10 mg by mouth daily as needed for allergies.   losartan  (COZAAR ) 25 MG tablet Take 0.5 tablets (12.5 mg total) by mouth daily.   metoprolol  tartrate (LOPRESSOR ) 25 MG tablet Take 1 tablet (25 mg total) by mouth 2 (two) times daily. Patient needs appointment for further refills. 1 st attempt   Multiple Vitamins-Minerals (CENTRUM SILVER PO) Take 1 tablet by mouth daily.     nitroGLYCERIN  (NITROSTAT ) 0.4 MG SL tablet Place 1 tablet (0.4 mg total) under the tongue every 5 (five) minutes as needed for chest pain (3 doses max).   REPATHA  SURECLICK 140 MG/ML SOAJ Inject 140 mg into the skin every 14 (fourteen) days.   rosuvastatin (CRESTOR) 40 MG tablet Take 40 mg by mouth daily.       Allergies:   Codeine sulfate, Iodine, and Oxycodone-acetaminophen   Social History   Socioeconomic History   Marital status: Married    Spouse name: Not on file   Number of children: Not on file   Years of education: Not on file   Highest education level: Not on file  Occupational History   Not on file  Tobacco Use   Smoking status: Never   Smokeless tobacco: Never  Vaping Use   Vaping  status: Never Used  Substance and Sexual Activity   Alcohol use: Never   Drug use: Never   Sexual activity: Not on file  Other Topics Concern   Not on file  Social History Narrative   Not on file   Social Drivers of Health   Financial Resource Strain: Not on file  Food Insecurity: Not on file  Transportation Needs: Not on file  Physical Activity: Not on file  Stress: Not on file  Social Connections: Not on file     Family History: The patient's family history includes Coronary artery disease in his father; Ovarian cancer in his mother; Valvular heart disease in his mother. ROS:   Please see the history of present illness.    All 14 point review of systems negative except as described per history  of present illness.  EKGs/Labs/Other Studies Reviewed:    The following studies were reviewed today:   EKG:  EKG Interpretation Date/Time:  Tuesday July 01 2024 12:49:17 EST Ventricular Rate:  73 PR Interval:  192 QRS Duration:  88 QT Interval:  360 QTC Calculation: 396 R Axis:   130  Text Interpretation: Normal sinus rhythm Left posterior fascicular block Possible Inferior infarct , age undetermined Abnormal ECG When compared with ECG of 27-Apr-2023 15:14, Left posterior fascicular block is now Present T wave inversion now evident in Inferior leads Nonspecific T wave abnormality now evident in Lateral leads Confirmed by Liborio Hai reddy [phone]) on 07/01/2024 1:02:31 PM    Recent Labs: No results found for requested labs within last 365 days.  Recent Lipid Panel    Component Value Date/Time   CHOL 176 07/01/2008 0935   TRIG 136 07/01/2008 0935   HDL 32.5 (L) 07/01/2008 0935   CHOLHDL 5.4 CALC 07/01/2008 0935   VLDL 27 07/01/2008 0935   LDLCALC 116 (H) 07/01/2008 0935   LDLDIRECT 149.9 02/03/2008 1006    Physical Exam:    VS:  BP 126/82   Pulse 73   Ht 5' 10 (1.778 m)   Wt 192 lb 6.4 oz (87.3 kg)   SpO2 96%   BMI 27.61 kg/m     Wt Readings from  Last 3 Encounters:  07/01/24 192 lb 6.4 oz (87.3 kg)  06/13/23 186 lb 12.8 oz (84.7 kg)  05/11/23 184 lb 3.2 oz (83.6 kg)     GENERAL:  Well nourished, well developed in no acute distress NECK: No JVD; No carotid bruits CARDIAC: Sternotomy scar observed.  RRR, S1 and S2 present, no murmurs, no rubs, no gallops CHEST:  Clear to auscultation without rales, wheezing or rhonchi  Extremities: No pitting pedal edema. Pulses bilaterally symmetric with radial 2+ and dorsalis pedis 2+ NEUROLOGIC:  Alert and oriented x 3  Medication Adjustments/Labs and Tests Ordered: Current medicines are reviewed at length with the patient today.  Concerns regarding medicines are outlined above.  Orders Placed This Encounter  Procedures   EKG 12-Lead   No orders of the defined types were placed in this encounter.   Signed, Hai jess Liborio, MD, MPH, [facility]. 07/01/2024 1:17 PM    [facility]

## 2024-07-25 ENCOUNTER — Telehealth: Payer: Self-pay | Admitting: *Deleted

## 2024-07-25 MED ORDER — METOPROLOL TARTRATE 25 MG PO TABS: 25.0000 mg | ORAL_TABLET | Freq: Two times a day (BID) | ORAL | 11 refills | Status: DC

## 2024-07-25 NOTE — Telephone Encounter (Signed)
 Rx refill sent to pharmacy.

## 2024-07-28 ENCOUNTER — Telehealth: Payer: Self-pay | Admitting: *Deleted

## 2024-07-28 MED ORDER — METOPROLOL TARTRATE 25 MG PO TABS: 25.0000 mg | ORAL_TABLET | Freq: Two times a day (BID) | ORAL | 3 refills | Status: AC

## 2024-07-28 MED ORDER — LOSARTAN POTASSIUM 25 MG PO TABS: 12.5000 mg | ORAL_TABLET | Freq: Every day | ORAL | 2 refills | Status: AC

## 2024-07-28 NOTE — Telephone Encounter (Signed)
 Rx refill sent to pharmacy.

## 2024-08-18 ENCOUNTER — Other Ambulatory Visit: Payer: Self-pay

## 2024-08-18 MED ORDER — CLOPIDOGREL BISULFATE 75 MG PO TABS: 75.0000 mg | ORAL_TABLET | Freq: Every day | ORAL | 2 refills | Status: AC

## 2024-09-05 ENCOUNTER — Other Ambulatory Visit: Payer: Self-pay | Admitting: Pharmacist

## 2024-09-05 MED ORDER — REPATHA SURECLICK 140 MG/ML ~~LOC~~ SOAJ: 1.0000 | SUBCUTANEOUS | 3 refills | Status: AC
